# Patient Record
Sex: Female | Born: 1950 | Hispanic: Yes | Marital: Married | Smoking: Never smoker
Health system: Southern US, Community
[De-identification: ages and names within clinical notes are randomized; demographics above are authoritative.]

## PROBLEM LIST (undated history)

## (undated) DIAGNOSIS — R519 Headache, unspecified: Secondary | ICD-10-CM

## (undated) DIAGNOSIS — K7682 Hepatic encephalopathy: Secondary | ICD-10-CM

## (undated) DIAGNOSIS — F329 Major depressive disorder, single episode, unspecified: Secondary | ICD-10-CM

## (undated) DIAGNOSIS — K7460 Unspecified cirrhosis of liver: Secondary | ICD-10-CM

## (undated) DIAGNOSIS — J189 Pneumonia, unspecified organism: Secondary | ICD-10-CM

## (undated) DIAGNOSIS — K7290 Hepatic failure, unspecified without coma: Secondary | ICD-10-CM

## (undated) DIAGNOSIS — J42 Unspecified chronic bronchitis: Secondary | ICD-10-CM

## (undated) DIAGNOSIS — M549 Dorsalgia, unspecified: Secondary | ICD-10-CM

## (undated) DIAGNOSIS — K922 Gastrointestinal hemorrhage, unspecified: Secondary | ICD-10-CM

## (undated) DIAGNOSIS — K2970 Gastritis, unspecified, without bleeding: Secondary | ICD-10-CM

## (undated) DIAGNOSIS — R51 Headache: Secondary | ICD-10-CM

## (undated) DIAGNOSIS — G8929 Other chronic pain: Secondary | ICD-10-CM

## (undated) DIAGNOSIS — F32A Depression, unspecified: Secondary | ICD-10-CM

## (undated) DIAGNOSIS — I10 Essential (primary) hypertension: Secondary | ICD-10-CM

## (undated) HISTORY — DX: Hepatic encephalopathy: K76.82

## (undated) HISTORY — DX: Hepatic failure, unspecified without coma: K72.90

## (undated) HISTORY — PX: CHOLECYSTECTOMY OPEN: SUR202

## (undated) HISTORY — DX: Gastrointestinal hemorrhage, unspecified: K92.2

---

## 2015-10-09 ENCOUNTER — Encounter (HOSPITAL_COMMUNITY): Payer: Self-pay | Admitting: Emergency Medicine

## 2015-10-09 ENCOUNTER — Emergency Department (HOSPITAL_COMMUNITY): Payer: Self-pay

## 2015-10-09 ENCOUNTER — Inpatient Hospital Stay (HOSPITAL_COMMUNITY): Payer: Self-pay

## 2015-10-09 ENCOUNTER — Inpatient Hospital Stay (HOSPITAL_COMMUNITY)
Admission: EM | Admit: 2015-10-09 | Discharge: 2015-10-10 | DRG: 442 | Disposition: A | Discharge status: Home / Self Care | Payer: Self-pay | Attending: Family Medicine | Admitting: Family Medicine

## 2015-10-09 DIAGNOSIS — K7682 Hepatic encephalopathy: Secondary | ICD-10-CM | POA: Diagnosis present

## 2015-10-09 DIAGNOSIS — D696 Thrombocytopenia, unspecified: Secondary | ICD-10-CM | POA: Diagnosis present

## 2015-10-09 DIAGNOSIS — K7200 Acute and subacute hepatic failure without coma: Principal | ICD-10-CM | POA: Diagnosis present

## 2015-10-09 DIAGNOSIS — K7469 Other cirrhosis of liver: Secondary | ICD-10-CM

## 2015-10-09 DIAGNOSIS — N179 Acute kidney failure, unspecified: Secondary | ICD-10-CM | POA: Diagnosis present

## 2015-10-09 DIAGNOSIS — R109 Unspecified abdominal pain: Secondary | ICD-10-CM | POA: Diagnosis present

## 2015-10-09 DIAGNOSIS — K2970 Gastritis, unspecified, without bleeding: Secondary | ICD-10-CM | POA: Diagnosis present

## 2015-10-09 DIAGNOSIS — K7460 Unspecified cirrhosis of liver: Secondary | ICD-10-CM | POA: Diagnosis present

## 2015-10-09 DIAGNOSIS — N3941 Urge incontinence: Secondary | ICD-10-CM | POA: Diagnosis present

## 2015-10-09 DIAGNOSIS — R1013 Epigastric pain: Secondary | ICD-10-CM

## 2015-10-09 DIAGNOSIS — I10 Essential (primary) hypertension: Secondary | ICD-10-CM | POA: Diagnosis present

## 2015-10-09 DIAGNOSIS — R7989 Other specified abnormal findings of blood chemistry: Secondary | ICD-10-CM

## 2015-10-09 DIAGNOSIS — R001 Bradycardia, unspecified: Secondary | ICD-10-CM | POA: Diagnosis present

## 2015-10-09 DIAGNOSIS — K766 Portal hypertension: Secondary | ICD-10-CM | POA: Diagnosis present

## 2015-10-09 DIAGNOSIS — K7290 Hepatic failure, unspecified without coma: Secondary | ICD-10-CM | POA: Insufficient documentation

## 2015-10-09 DIAGNOSIS — J90 Pleural effusion, not elsewhere classified: Secondary | ICD-10-CM | POA: Diagnosis present

## 2015-10-09 DIAGNOSIS — D649 Anemia, unspecified: Secondary | ICD-10-CM | POA: Diagnosis present

## 2015-10-09 DIAGNOSIS — R161 Splenomegaly, not elsewhere classified: Secondary | ICD-10-CM | POA: Diagnosis present

## 2015-10-09 DIAGNOSIS — R1011 Right upper quadrant pain: Secondary | ICD-10-CM | POA: Diagnosis present

## 2015-10-09 DIAGNOSIS — K2950 Unspecified chronic gastritis without bleeding: Secondary | ICD-10-CM | POA: Diagnosis present

## 2015-10-09 HISTORY — DX: Essential (primary) hypertension: I10

## 2015-10-09 HISTORY — DX: Gastritis, unspecified, without bleeding: K29.70

## 2015-10-09 HISTORY — DX: Unspecified cirrhosis of liver: K74.60

## 2015-10-09 LAB — BRAIN NATRIURETIC PEPTIDE: B NATRIURETIC PEPTIDE 5: 97.8 pg/mL (ref 0.0–100.0)

## 2015-10-09 LAB — COMPREHENSIVE METABOLIC PANEL
ALBUMIN: 2.5 g/dL — AB (ref 3.5–5.0)
ALK PHOS: 111 U/L (ref 38–126)
ALT: 30 U/L (ref 14–54)
AST: 51 U/L — AB (ref 15–41)
Anion gap: 5 (ref 5–15)
BILIRUBIN TOTAL: 4.7 mg/dL — AB (ref 0.3–1.2)
BUN: 17 mg/dL (ref 6–20)
CALCIUM: 8.8 mg/dL — AB (ref 8.9–10.3)
CO2: 23 mmol/L (ref 22–32)
Chloride: 109 mmol/L (ref 101–111)
Creatinine, Ser: 1.04 mg/dL — ABNORMAL HIGH (ref 0.44–1.00)
GFR calc Af Amer: >60 mL/min (ref >60)
GFR calc non Af Amer: 56 mL/min — ABNORMAL LOW (ref >60)
GLUCOSE: 101 mg/dL — AB (ref 65–99)
Potassium: 4 mmol/L (ref 3.5–5.1)
Sodium: 137 mmol/L (ref 135–145)
Total Protein: 6.5 g/dL (ref 6.5–8.1)

## 2015-10-09 LAB — CBC WITH DIFFERENTIAL/PLATELET
Basophils Absolute: 0 10*3/uL (ref 0.0–0.1)
Basophils Relative: 1 %
EOS ABS: 0.2 10*3/uL (ref 0.0–0.7)
Eosinophils Relative: 5 %
HEMATOCRIT: 29.3 % — AB (ref 36.0–46.0)
HEMOGLOBIN: 10.3 g/dL — AB (ref 12.0–15.0)
LYMPHS ABS: 1.3 10*3/uL (ref 0.7–4.0)
Lymphocytes Relative: 34 %
MCH: 33.9 pg (ref 26.0–34.0)
MCHC: 35.2 g/dL (ref 30.0–36.0)
MCV: 96.4 fL (ref 78.0–100.0)
MONOS PCT: 9 %
Monocytes Absolute: 0.3 10*3/uL (ref 0.1–1.0)
NEUTROS ABS: 2 10*3/uL (ref 1.7–7.7)
NEUTROS PCT: 53 %
Platelets: 67 10*3/uL — ABNORMAL LOW (ref 150–400)
RBC: 3.04 MIL/uL — AB (ref 3.87–5.11)
RDW: 15.5 % (ref 11.5–15.5)
WBC: 3.7 10*3/uL — AB (ref 4.0–10.5)

## 2015-10-09 LAB — URINALYSIS, ROUTINE W REFLEX MICROSCOPIC
GLUCOSE, UA: NEGATIVE mg/dL
Ketones, ur: NEGATIVE mg/dL
Nitrite: NEGATIVE
PROTEIN: NEGATIVE mg/dL
SPECIFIC GRAVITY, URINE: 1.012 (ref 1.005–1.030)
pH: 7 (ref 5.0–8.0)

## 2015-10-09 LAB — URINE MICROSCOPIC-ADD ON

## 2015-10-09 LAB — GAMMA GT: GGT: 22 U/L (ref 7–50)

## 2015-10-09 LAB — AMMONIA: AMMONIA: 99 umol/L — AB (ref 9–35)

## 2015-10-09 LAB — CK: CK TOTAL: 157 U/L (ref 38–234)

## 2015-10-09 LAB — LIPASE, BLOOD: Lipase: 65 U/L — ABNORMAL HIGH (ref 11–51)

## 2015-10-09 MED ORDER — FUROSEMIDE 40 MG PO TABS
40.0000 mg | ORAL_TABLET | Freq: Every day | ORAL | Status: DC
  Administered 2015-10-09 – 2015-10-10 (×2): 40 mg via ORAL
  Filled 2015-10-09: qty 2
  Filled 2015-10-09: qty 1

## 2015-10-09 MED ORDER — ONDANSETRON HCL 4 MG/2ML IJ SOLN: 4.0000 mg | Freq: Three times a day (TID) | INTRAMUSCULAR | Status: AC | PRN

## 2015-10-09 MED ORDER — ACETAMINOPHEN 325 MG PO TABS: 650.0000 mg | ORAL_TABLET | Freq: Four times a day (QID) | ORAL | Status: DC | PRN

## 2015-10-09 MED ORDER — SUCRALFATE 1 GM/10ML PO SUSP
1.0000 g | Freq: Three times a day (TID) | ORAL | Status: DC
  Administered 2015-10-09 – 2015-10-10 (×2): 1 g via ORAL
  Filled 2015-10-09 (×2): qty 10

## 2015-10-09 MED ORDER — PANTOPRAZOLE SODIUM 40 MG PO TBEC
40.0000 mg | DELAYED_RELEASE_TABLET | Freq: Every day | ORAL | Status: DC
  Administered 2015-10-09: 40 mg via ORAL
  Filled 2015-10-09: qty 1

## 2015-10-09 MED ORDER — IOPAMIDOL (ISOVUE-300) INJECTION 61%
INTRAVENOUS | Status: AC
  Administered 2015-10-09: 100 mL
  Filled 2015-10-09: qty 100

## 2015-10-09 MED ORDER — SODIUM CHLORIDE 0.9 % IV SOLN
INTRAVENOUS | Status: DC
  Administered 2015-10-09: 15:00:00 via INTRAVENOUS

## 2015-10-09 MED ORDER — FAMOTIDINE IN NACL 20-0.9 MG/50ML-% IV SOLN
20.0000 mg | Freq: Two times a day (BID) | INTRAVENOUS | Status: DC
  Administered 2015-10-09 – 2015-10-10 (×2): 20 mg via INTRAVENOUS
  Filled 2015-10-09 (×3): qty 50

## 2015-10-09 MED ORDER — FAMOTIDINE IN NACL 20-0.9 MG/50ML-% IV SOLN
20.0000 mg | Freq: Once | INTRAVENOUS | Status: AC
  Administered 2015-10-09: 20 mg via INTRAVENOUS
  Filled 2015-10-09: qty 50

## 2015-10-09 MED ORDER — LACTULOSE 10 GM/15ML PO SOLN
30.0000 g | Freq: Once | ORAL | Status: AC
  Administered 2015-10-09: 30 g via ORAL
  Filled 2015-10-09: qty 45

## 2015-10-09 MED ORDER — SODIUM CHLORIDE 0.9 % IV BOLUS (SEPSIS)
500.0000 mL | Freq: Once | INTRAVENOUS | Status: AC
  Administered 2015-10-09: 500 mL via INTRAVENOUS

## 2015-10-09 MED ORDER — SODIUM CHLORIDE 0.9 % IV SOLN
125.0000 mg | Freq: Once | INTRAVENOUS | Status: AC
  Administered 2015-10-09: 125 mg via INTRAVENOUS
  Filled 2015-10-09: qty 10

## 2015-10-09 MED ORDER — ACETAMINOPHEN 650 MG RE SUPP: 650.0000 mg | Freq: Four times a day (QID) | RECTAL | Status: DC | PRN

## 2015-10-09 MED ORDER — ENOXAPARIN SODIUM 40 MG/0.4ML ~~LOC~~ SOLN: 40.0000 mg | SUBCUTANEOUS | Status: DC

## 2015-10-09 MED ORDER — LACTULOSE 10 GM/15ML PO SOLN
30.0000 g | Freq: Two times a day (BID) | ORAL | Status: DC
  Administered 2015-10-09 – 2015-10-10 (×2): 30 g via ORAL
  Filled 2015-10-09 (×3): qty 45

## 2015-10-09 MED ORDER — ONDANSETRON HCL 4 MG/2ML IJ SOLN: 4.0000 mg | Freq: Four times a day (QID) | INTRAMUSCULAR | Status: DC | PRN

## 2015-10-09 MED ORDER — PROPRANOLOL HCL 10 MG PO TABS
10.0000 mg | ORAL_TABLET | Freq: Two times a day (BID) | ORAL | Status: DC
Start: 2015-10-09 — End: 2015-10-09

## 2015-10-09 MED ORDER — IRON DEXTRAN 50 MG/ML IJ SOLN: 100.0000 mg | Freq: Once | INTRAMUSCULAR | Status: DC

## 2015-10-09 MED ORDER — ONDANSETRON HCL 4 MG PO TABS: 4.0000 mg | ORAL_TABLET | Freq: Four times a day (QID) | ORAL | Status: DC | PRN

## 2015-10-09 MED ORDER — PROPRANOLOL HCL 10 MG PO TABS
10.0000 mg | ORAL_TABLET | Freq: Two times a day (BID) | ORAL | Status: DC
  Administered 2015-10-09 – 2015-10-10 (×2): 10 mg via ORAL
  Filled 2015-10-09 (×2): qty 1

## 2015-10-09 NOTE — ED Notes (Signed)
Attempted report x1. 

## 2015-10-09 NOTE — ED Provider Notes (Signed)
[facility]-EMERGENCY DEPT Provider Note   CSN: [id number] Arrival date & time: 10/09/15  [id number]     History   Chief Complaint Chief Complaint  Patient presents with  . Abdominal Pain    HPI Kelly Mckinney is a 65 y.o. female.  HPI   Pt with hx cirrhosis (from medications), gastritis  p/w increase in chronic upper abdominal pain over the past 2 weeks.  Pain comes and goes.  Has had the symptoms for several years, treated in [place] for gastritis, has been taking the medications but it no longer helps.  [place] two [place].  Family states she has been intermittently confused - today was unsure of where she was, what she was doing.  Pt notes she has been depressed - family explains she has children in [place] she is unable to see.  Past abdominal surgeries: c-section, cholecystectomy.    Denies nausea, vomiting, bowel changes, urinary symptoms.  Pt unsure of her medications.    Past Medical History:  Diagnosis Date  . Hypertension     Patient Active Problem List   Diagnosis Date Noted  . Hepatic encephalopathy (HCC) 10/09/2015    History reviewed. No pertinent surgical history.  OB History    No data available       Home Medications    Prior to Admission medications   Medication Sig Start Date End Date Taking? Authorizing Provider  furosemide (LASIX) 40 MG tablet Take 40 mg by mouth daily.   Yes Historical Provider, MD  Omeprazole-Sodium Bicarbonate (ZEGERID) 20-1100 MG CAPS capsule Take 1 capsule by mouth daily before breakfast.   Yes Historical Provider, MD  OVER THE COUNTER MEDICATION Take 1 tablet by mouth daily. Pulmonarom Lisados bacterianos caja con 1 oral vial. For 10 days. Then back on for 10 days   Yes Historical Provider, MD  PRESCRIPTION MEDICATION Take 1 tablet by mouth every 6 (six) hours as needed (pain. ( clonixinaton) 125 mg clorthidraton 10 mg excipiente 1 tablet  ( from [place])).   Yes Historical Provider, MD  PRESCRIPTION MEDICATION Take 1 tablet by  mouth daily. ( Pisarpek. 500 mg 1 tablet daily.( from [place]) Levetiracetam   Yes Historical Provider, MD  propranolol (INDERAL) 10 MG tablet Take 10 mg by mouth 2 (two) times daily.   Yes Historical Provider, MD    Family History No family history on file.  Social History Social History  Substance Use Topics  . Smoking status: Never Smoker  . Smokeless tobacco: Never Used  . Alcohol use Not on file     Allergies   Review of patient's allergies indicates no known allergies.   Review of Systems Review of Systems  Unable to perform ROS: Mental status change     Physical Exam Updated Vital Signs BP 111/66   Pulse (!) 57   Temp 98.5 F (36.9 C) (Oral)   Resp 18   SpO2 100%   Physical Exam  Constitutional: She appears well-developed and well-nourished. No distress.  HENT:  Head: Normocephalic and atraumatic.  Neck: Neck supple.  Cardiovascular: Normal rate and regular rhythm.   Pulmonary/Chest: Effort normal and breath sounds normal. No respiratory distress. She has no wheezes. She has no rales.  Abdominal: Soft. Bowel sounds are normal. She exhibits no distension. There is tenderness in the epigastric area and left upper quadrant. There is no rebound and no guarding.  Neurological: She is alert.  Patient knows her name.  Does not know the month. Thinks it's 1997.  She  thinks she is currently in [place], [place].    Skin: She is not diaphoretic.  Psychiatric: She is slowed. She exhibits a depressed mood.  Nursing note and vitals reviewed.    ED Treatments / Results  Labs (all labs ordered are listed, but only abnormal results are displayed) Labs Reviewed  COMPREHENSIVE METABOLIC PANEL - Abnormal; Notable for the following:       Result Value   Glucose, Bld 101 (*)    Creatinine, Ser 1.04 (*)    Calcium 8.8 (*)    Albumin 2.5 (*)    AST 51 (*)    Total Bilirubin 4.7 (*)    GFR calc non Af Amer 56 (*)    All other components within normal limits  LIPASE,  BLOOD - Abnormal; Notable for the following:    Lipase 65 (*)    All other components within normal limits  CBC WITH DIFFERENTIAL/PLATELET - Abnormal; Notable for the following:    WBC 3.7 (*)    RBC 3.04 (*)    Hemoglobin 10.3 (*)    HCT 29.3 (*)    Platelets 67 (*)    All other components within normal limits  AMMONIA - Abnormal; Notable for the following:    Ammonia 99 (*)    All other components within normal limits  URINALYSIS, ROUTINE W REFLEX MICROSCOPIC (NOT AT [facility]) - Abnormal; Notable for the following:    Color, Urine AMBER (*)    APPearance CLOUDY (*)    Hgb urine dipstick LARGE (*)    Bilirubin Urine SMALL (*)    Leukocytes, UA SMALL (*)    All other components within normal limits  URINE MICROSCOPIC-ADD ON - Abnormal; Notable for the following:    Squamous Epithelial / LPF 6-30 (*)    Bacteria, UA MANY (*)    All other components within normal limits  BRAIN NATRIURETIC PEPTIDE    EKG  EKG Interpretation None       Radiology Dg Chest 2 View  Result Date: 10/09/2015 CLINICAL DATA:  Cardiomegaly and pleural effusion seen on CT. EXAM: CHEST  2 VIEW COMPARISON:  CT 10/09/2015 FINDINGS: There was trace left pleural fluid on the CT and this is not appreciated on these chest radiographs. Heart size is slightly enlarged. No focal airspace disease or pulmonary edema. No acute bone abnormality. IMPRESSION: Mild cardiomegaly without focal airspace disease or frank pulmonary edema. No significant pleural fluid. Electronically Signed   By: Richarda Overlie M.D.   On: 10/09/2015 13:46   Ct Head Wo Contrast  Result Date: 10/09/2015 CLINICAL DATA:  Confusion.  History of hypertension. EXAM: CT HEAD WITHOUT CONTRAST TECHNIQUE: Contiguous axial images were obtained from the base of the skull through the vertex without intravenous contrast. COMPARISON:  None. FINDINGS: Brain: Mild atrophy with sulcal prominence. Gray-white differentiation is maintained. No CT evidence of acute large  territory infarct. No intraparenchymal or extra-axial mass or hemorrhage. Normal size and configuration of the ventricles and the basilar cisterns. No midline shift. Vascular: No hyperdense vessel or unexpected calcification. Skull: No displaced calvarial fracture. Sinuses/Orbits: Limited visualization the paranasal sinuses and mastoid air cells is normal. No air-fluid levels. Other: Regional soft tissues appear normal. IMPRESSION: Mild atrophy without acute intracranial process. Electronically Signed   By: Simonne Come M.D.   On: 10/09/2015 12:20   Ct Abdomen Pelvis W Contrast  Result Date: 10/09/2015 CLINICAL DATA:  Diffuse abdominal pain for the past 2 days. Confusion. History of fatty liver disease. EXAM: CT ABDOMEN AND  PELVIS WITH CONTRAST TECHNIQUE: Multidetector CT imaging of the abdomen and pelvis was performed using the standard protocol following bolus administration of intravenous contrast. CONTRAST:  ISOVUE-300 IOPAMIDOL (ISOVUE-300) INJECTION 61% COMPARISON:  None. FINDINGS: Lower chest: Limited visualization of the lower thorax demonstrates a trace left-sided pleural effusion (image 1, series 2). Minimal dependent subpleural ground-glass atelectasis. No focal airspace opacities. Cardiomegaly. Small amount of pericardial fluid, presumably physiologic. Hepatobiliary: Nodular hepatic contour compatible with cirrhosis. No discrete hepatic lesions. There is re- cannulization of the tear umbilical vein (image 20, series 2) and development of a large splenorenal shunt (coronal image 61, series 5). No definitive esophageal or gastric varices. Trace amount of perihepatic ascites (image 92, series 5). Post cholecystectomy. No intra extrahepatic bili duct dilatation. Pancreas: Normal appearance of the pancreas. Spleen: Spleen is enlarged measuring 13.5 cm in length. Adrenals/Urinary Tract: There is symmetric enhancement and excretion of the bilateral kidneys. No renal stones. No discrete renal lesions.  No urine obstruction or perinephric stranding. Normal appearance of the bilateral adrenal glands. Normal appearance of the urinary bladder given degree distention. Stomach/Bowel: Nonobstructive bowel gas pattern. Normal appearance of the terminal ileum and appendix. No pneumoperitoneum, pneumatosis or portal venous gas. Vascular/Lymphatic: Normal caliber of the abdominal aorta. The major branch vessels of the abdominal aorta appear patent on this non CTA examination. No bulky retroperitoneal, mesenteric, pelvic or inguinal lymphadenopathy. Reproductive: Normal appearance of the pelvic organs. There is a trace amount of fluid within the pelvic cul-de-sac. Other: Large amount of body wall edema. Small mesenteric fat containing periumbilical hernia. Musculoskeletal: No acute or aggressive osseous abnormalities. IMPRESSION: 1. Cirrhosis with findings of portal venous hypertension including recanalization of the periumbilical vein, development of a large splenorenal shunt and mild splenomegaly. No definitive gastric or esophageal varices. No significant intra-abdominal ascites. Given history of confusion, correlation with ammonia level is recommended. 2. Otherwise, no definite explanation for patient's abdominal pain. 3. Cardiomegaly with trace left-sided pleural effusion and rather extensive body wall edema, nonspecific though could be seen in the setting of right-sided heart failure. Clinical correlation is advised. Electronically Signed   By: Simonne Come M.D.   On: 10/09/2015 12:28    Procedures Procedures (including critical care time)  Medications Ordered in ED Medications  sodium chloride 0.9 % bolus 500 mL (0 mLs Intravenous Stopped 10/09/15 1243)  famotidine (PEPCID) IVPB 20 mg premix (0 mg Intravenous Stopped 10/09/15 1040)  lactulose (CHRONULAC) 10 GM/15ML solution 30 g (30 g Oral Given 10/09/15 1242)  iopamidol (ISOVUE-300) 61 % injection (100 mLs  Contrast Given 10/09/15 1140)     Initial  Impression / Assessment and Plan / ED Course  I have reviewed the triage vital signs and the nursing notes.  Pertinent labs & imaging results that were available during my care of the patient were reviewed by me and considered in my medical decision making (see chart for details).  Clinical Course  Value Comment By Time  Ammonia: (!) 99 (Reviewed) Trixie Dredge, PA-C 08/18 1003    Afebrile nontoxic patient with hx cirrhosis, gastritis p/w upper abdominal pain, confusion per family.  Labs consistent with hepatic encephalopathy - liver abnormalities, elevated ammonia level.  CT abd/pelvis demonstrates changes associated with hepatic encephalopathy.  Pt also seen and examined by Dr Manus Gunning.  Admitted to [facility].  Toya Smothers, APP, accepts patient (Dr Dartha Lodge attending)  Final Clinical Impressions(s) / ED Diagnoses   Final diagnoses:  Hepatic encephalopathy [facility])    New Prescriptions New Prescriptions   No  medications on file     Trixie Dredgemily Cash Duce, [place]PA-C 10/09/15 1433    Glynn OctaveStephen Rancour, MD 10/09/15 [phone]

## 2015-10-09 NOTE — ED Notes (Signed)
Pt transported to [place]

## 2015-10-09 NOTE — ED Notes (Signed)
Pt used bedside commode with minimal assistance; BM

## 2015-10-09 NOTE — H&P (Signed)
History and Physical    Kelly Mckinney ZOX:[id number] DOB: Apr 18, 1950 DOA: 10/09/2015  PCP: No primary care provider on file. Patient coming from: home  Chief Complaint: abdominal pain/ams  HPI: Kelly Mckinney is a non english speaking 65 y.o. female with medical history significant for cirrhosis, gastritis, htn since emergency Department chief complaint of abdominal pain and altered mental status. Initial evaluation concerning for acute hepatic encephalopathy.  Information is obtained from the patient and her family through her family as she is not speaking this. She's been in this country about 3 weeks. She reports chronic upper abdominal pain that worsened over the last 3 or 4 days. She states that she's been treated for gastritis in [place] and taking her medicine properly. She states the pain is located right upper quadrant constant describes it as an ache. Associated symptoms include generalized lethargy, increased lower extremity edema, gradual shortness of breath and gradual worsening confusion per the family. No reports of nausea vomiting. No diarrhea constipation melena bright red blood per rectum. She does have urinary incontinence and urgency. She denies dysuria hematuria. She denies chest pain palpitation headache dizziness visual disturbances.    ED Course: The emergency department she's afebrile hemodynamically stable and not hypoxic. She is provided with 500 mL of normal saline and lactulose.  Review of Systems: As per HPI otherwise 10 point review of systems negative.   Ambulatory Status: She ambulates with a cane with a steady gait denies any recent falls  Past Medical History:  Diagnosis Date  . Cirrhosis (HCC)   . Gastritis   . Hypertension     History reviewed. No pertinent surgical history. Cholecystectomy and C-section  Social History   Social History  . Marital status: Married    Spouse name: N/A  . Number of children: N/A  . Years of education: N/A    Occupational History  . Not on file.   Social History Main Topics  . Smoking status: Never Smoker  . Smokeless tobacco: Never Used  . Alcohol use Not on file  . Drug use: Unknown  . Sexual activity: Not on file   Other Topics Concern  . Not on file   Social History Narrative  . No narrative on file    No Known Allergies  No family history on file. Unable to obtain family history at this time  Prior to Admission medications   Medication Sig Start Date End Date Taking? Authorizing Provider  furosemide (LASIX) 40 MG tablet Take 40 mg by mouth daily.   Yes Historical Provider, MD  Omeprazole-Sodium Bicarbonate (ZEGERID) 20-1100 MG CAPS capsule Take 1 capsule by mouth daily before breakfast.   Yes Historical Provider, MD  OVER THE COUNTER MEDICATION Take 1 tablet by mouth daily. Pulmonarom Lisados bacterianos caja con 1 oral vial. For 10 days. Then back on for 10 days   Yes Historical Provider, MD  PRESCRIPTION MEDICATION Take 1 tablet by mouth every 6 (six) hours as needed (pain. ( clonixinaton) 125 mg clorthidraton 10 mg excipiente 1 tablet  ( from [place])).   Yes Historical Provider, MD  PRESCRIPTION MEDICATION Take 1 tablet by mouth daily. ( Pisarpek. 500 mg 1 tablet daily.( from [place]) Levetiracetam   Yes Historical Provider, MD  propranolol (INDERAL) 10 MG tablet Take 10 mg by mouth 2 (two) times daily.   Yes Historical Provider, MD    Physical Exam: Vitals:   10/09/15 1040 10/09/15 1242 10/09/15 1403 10/09/15 1506  BP: 135/75 123/72 111/66 131/71  Pulse: 63 62 (!)  57 (!) 56  Resp: 16 18 18 17   Temp:  98.5 F (36.9 C)    TempSrc:  Oral    SpO2: 100% 100% 100% 100%     General:  Appears calm and comfortable  Eyes:  PERRL, EOMI, normal lids, iris + scleral icterus ENT:  grossly normal hearing, lips & tongue, his membranes of her mouth are pink and moist Neck:  no LAD, masses or thyromegaly Cardiovascular:  RRR, no m/r/g. Trace LE edema. Venous stasis changes  bilaterally Respiratory:  CTA bilaterally, no w/r/r. Normal respiratory effort. Abdomen:  Soft, slightly distended +BS mild tenderness upper quadrants Skin:  no rash or induration seen on limited exam somewhat jaundiced Musculoskeletal:  grossly normal tone BUE/BLE, good ROM, no bony abnormality Psychiatric:  grossly normal mood and affect, slightly anxious Neurologic:  Speech clear, facial symmetry follows commands. Oriented to self only  Labs on Admission: I have personally reviewed following labs and imaging studies  CBC:  Recent Labs Lab 10/09/15 0919  WBC 3.7*  NEUTROABS 2.0  HGB 10.3*  HCT 29.3*  MCV 96.4  PLT 67*   Basic Metabolic Panel:  Recent Labs Lab 10/09/15 0919  NA 137  K 4.0  CL 109  CO2 23  GLUCOSE 101*  BUN 17  CREATININE 1.04*  CALCIUM 8.8*   GFR: CrCl cannot be calculated (Unknown ideal weight.). Liver Function Tests:  Recent Labs Lab 10/09/15 0919  AST 51*  ALT 30  ALKPHOS 111  BILITOT 4.7*  PROT 6.5  ALBUMIN 2.5*    Recent Labs Lab 10/09/15 0919  LIPASE 65*    Recent Labs Lab 10/09/15 0919  AMMONIA 99*   Coagulation Profile: No results for input(s): INR, PROTIME in the last 168 hours. Cardiac Enzymes: No results for input(s): CKTOTAL, CKMB, CKMBINDEX, TROPONINI in the last 168 hours. BNP (last 3 results) No results for input(s): PROBNP in the last 8760 hours. HbA1C: No results for input(s): HGBA1C in the last 72 hours. CBG: No results for input(s): GLUCAP in the last 168 hours. Lipid Profile: No results for input(s): CHOL, HDL, LDLCALC, TRIG, CHOLHDL, LDLDIRECT in the last 72 hours. Thyroid Function Tests: No results for input(s): TSH, T4TOTAL, FREET4, T3FREE, THYROIDAB in the last 72 hours. Anemia Panel: No results for input(s): VITAMINB12, FOLATE, FERRITIN, TIBC, IRON, RETICCTPCT in the last 72 hours. Urine analysis:    Component Value Date/Time   COLORURINE AMBER (A) 10/09/2015 1023   APPEARANCEUR CLOUDY (A)  10/09/2015 1023   LABSPEC 1.012 10/09/2015 1023   PHURINE 7.0 10/09/2015 1023   GLUCOSEU NEGATIVE 10/09/2015 1023   HGBUR LARGE (A) 10/09/2015 1023   BILIRUBINUR SMALL (A) 10/09/2015 1023   KETONESUR NEGATIVE 10/09/2015 1023   PROTEINUR NEGATIVE 10/09/2015 1023   NITRITE NEGATIVE 10/09/2015 1023   LEUKOCYTESUR SMALL (A) 10/09/2015 1023    Creatinine Clearance: CrCl cannot be calculated (Unknown ideal weight.).  Sepsis Labs: @LABRCNTIP (procalcitonin:4,lacticidven:4) )No results found for this or any previous visit (from the past 240 hour(s)).   Radiological Exams on Admission: Dg Chest 2 View  Result Date: 10/09/2015 CLINICAL DATA:  Cardiomegaly and pleural effusion seen on CT. EXAM: CHEST  2 VIEW COMPARISON:  CT 10/09/2015 FINDINGS: There was trace left pleural fluid on the CT and this is not appreciated on these chest radiographs. Heart size is slightly enlarged. No focal airspace disease or pulmonary edema. No acute bone abnormality. IMPRESSION: Mild cardiomegaly without focal airspace disease or frank pulmonary edema. No significant pleural fluid. Electronically Signed   By: Madelaine Bhat  Lowella DandyHenn M.D.   On: 10/09/2015 13:46   Ct Head Wo Contrast  Result Date: 10/09/2015 CLINICAL DATA:  Confusion.  History of hypertension. EXAM: CT HEAD WITHOUT CONTRAST TECHNIQUE: Contiguous axial images were obtained from the base of the skull through the vertex without intravenous contrast. COMPARISON:  None. FINDINGS: Brain: Mild atrophy with sulcal prominence. Gray-white differentiation is maintained. No CT evidence of acute large territory infarct. No intraparenchymal or extra-axial mass or hemorrhage. Normal size and configuration of the ventricles and the basilar cisterns. No midline shift. Vascular: No hyperdense vessel or unexpected calcification. Skull: No displaced calvarial fracture. Sinuses/Orbits: Limited visualization the paranasal sinuses and mastoid air cells is normal. No air-fluid levels. Other:  Regional soft tissues appear normal. IMPRESSION: Mild atrophy without acute intracranial process. Electronically Signed   By: Simonne ComeJohn  Watts M.D.   On: 10/09/2015 12:20   Ct Abdomen Pelvis W Contrast  Result Date: 10/09/2015 CLINICAL DATA:  Diffuse abdominal pain for the past 2 days. Confusion. History of fatty liver disease. EXAM: CT ABDOMEN AND PELVIS WITH CONTRAST TECHNIQUE: Multidetector CT imaging of the abdomen and pelvis was performed using the standard protocol following bolus administration of intravenous contrast. CONTRAST:  100mL ISOVUE-300 IOPAMIDOL (ISOVUE-300) INJECTION 61% COMPARISON:  None. FINDINGS: Lower chest: Limited visualization of the lower thorax demonstrates a trace left-sided pleural effusion (image 1, series 2). Minimal dependent subpleural ground-glass atelectasis. No focal airspace opacities. Cardiomegaly. Small amount of pericardial fluid, presumably physiologic. Hepatobiliary: Nodular hepatic contour compatible with cirrhosis. No discrete hepatic lesions. There is re- cannulization of the tear umbilical vein (image 20, series 2) and development of a large splenorenal shunt (coronal image 61, series 5). No definitive esophageal or gastric varices. Trace amount of perihepatic ascites (image 92, series 5). Post cholecystectomy. No intra extrahepatic bili duct dilatation. Pancreas: Normal appearance of the pancreas. Spleen: Spleen is enlarged measuring 13.5 cm in length. Adrenals/Urinary Tract: There is symmetric enhancement and excretion of the bilateral kidneys. No renal stones. No discrete renal lesions. No urine obstruction or perinephric stranding. Normal appearance of the bilateral adrenal glands. Normal appearance of the urinary bladder given degree distention. Stomach/Bowel: Nonobstructive bowel gas pattern. Normal appearance of the terminal ileum and appendix. No pneumoperitoneum, pneumatosis or portal venous gas. Vascular/Lymphatic: Normal caliber of the abdominal aorta. The  major branch vessels of the abdominal aorta appear patent on this non CTA examination. No bulky retroperitoneal, mesenteric, pelvic or inguinal lymphadenopathy. Reproductive: Normal appearance of the pelvic organs. There is a trace amount of fluid within the pelvic cul-de-sac. Other: Large amount of body wall edema. Small mesenteric fat containing periumbilical hernia. Musculoskeletal: No acute or aggressive osseous abnormalities. IMPRESSION: 1. Cirrhosis with findings of portal venous hypertension including recanalization of the periumbilical vein, development of a large splenorenal shunt and mild splenomegaly. No definitive gastric or esophageal varices. No significant intra-abdominal ascites. Given history of confusion, correlation with ammonia level is recommended. 2. Otherwise, no definite explanation for patient's abdominal pain. 3. Cardiomegaly with trace left-sided pleural effusion and rather extensive body wall edema, nonspecific though could be seen in the setting of right-sided heart failure. Clinical correlation is advised. Electronically Signed   By: Simonne ComeJohn  Watts M.D.   On: 10/09/2015 12:28    EKG: Independently reviewed pending  Assessment/Plan Principal Problem:   Acute hepatic encephalopathy (HCC) Active Problems:   Anemia   Acute kidney injury (HCC)   Thrombocytopenia (HCC)   Pleural effusion   Abdominal pain   Gastritis   Bradycardia   Cirrhosis (HCC)  1. Acute hepatic encephalopathy. Ammonia level 99. Total bilirubin 4.6. Patient with a history of cirrhosis reportedly. CT of the head without acute abnormality. chest x-ray with mild cardiomegaly without frank pulmonary edema. Afebrile, no leukocytosis non-toxic appearing.  He is provided with lactulose in the emergency department. Urinalysis unremarkable.  -admit to med surg -continue lactulose -obtain US abdomen -Obtain echocardiogram -monitor  #2. Cirrhosis/abdominal pain. Hx of same.  Etiology unclear. AST slightly  elevated ALT alkaline phosphatase within limits of normal. Total bilirubin 4.7. Lipase within limits of normal.  Abdomina CT with cirrhosis and portal venous HTN large splenorenal shunt and splenomegaly.  -continue BB with parameters -continue lactulose -continue lasix -obtain US -Obtain echocardiogram -low threshold for antibiotics as some concern for SBP  #3. Acute kidney injury. Mild. Creatinine 1.04 on admission. Likely related to decreased oral intake. -Hold nephrotoxins -Monitor urine output  #4. Thrombocytopenia. Likely related to #2. No obvious signs symptoms bleeding. -Obtain PT/INR -SCDs  #5. Abdominal pain/gastritis. Patient reports pain resolved on admission. Reports her PCP in [place] treating her for chronic gastritis.  -PPI  #6. Bradycardia. Rate range 57-63. Likely related to panel all -Continue propanolol with parameters -Obtain an EKG   #7. Pleural effusion. Small on left her CT above. Oxygen saturation levels 100% on room air -Monitor   DVT prophylaxis: scd  Code Status: full  Family Communication: family at bedside  Disposition Plan: home when ready  Consults called: none Admission status: inpatient    Gwenyth BenderBLACK,KAREN M MD [facility]  If 7PM-7AM, please contact night-coverage [email or web address] Password [email or web address]  10/09/2015, 3:39 PM

## 2015-10-09 NOTE — ED Triage Notes (Addendum)
abd pain x 2 days  No vomiting or diarrhea last ate yesterday hurts all over hurts to void, per family pt has fatty liver and cannot take meds that affect her liver

## 2015-10-09 NOTE — ED Notes (Signed)
Changed the pt's brief and Chux.  Placed another Chux under the patient and brief on the patient.

## 2015-10-09 NOTE — ED Notes (Signed)
Pt back in room.

## 2015-10-09 NOTE — ED Notes (Signed)
Attempted report x 2 

## 2015-10-09 NOTE — ED Provider Notes (Signed)
EMERGENCY DEPARTMENT US FAST EXAM  INDICATIONS:Teaching study  PERFORMED BY: Myself  IMAGES ARCHIVED?: Yes  FINDINGS: All views negative  LIMITATIONS:  Body habitus and Emergent procedure  INTERPRETATION:  No abdominal free fluid and No pericardial effusion  COMMENT:  No ascites     Glynn OctaveStephen Derrious Bologna, MD 10/09/15 1552

## 2015-10-10 DIAGNOSIS — R109 Unspecified abdominal pain: Secondary | ICD-10-CM

## 2015-10-10 DIAGNOSIS — K7469 Other cirrhosis of liver: Secondary | ICD-10-CM

## 2015-10-10 DIAGNOSIS — K2970 Gastritis, unspecified, without bleeding: Secondary | ICD-10-CM

## 2015-10-10 DIAGNOSIS — J90 Pleural effusion, not elsewhere classified: Secondary | ICD-10-CM

## 2015-10-10 DIAGNOSIS — D696 Thrombocytopenia, unspecified: Secondary | ICD-10-CM

## 2015-10-10 DIAGNOSIS — K7200 Acute and subacute hepatic failure without coma: Principal | ICD-10-CM

## 2015-10-10 DIAGNOSIS — N179 Acute kidney failure, unspecified: Secondary | ICD-10-CM

## 2015-10-10 DIAGNOSIS — D649 Anemia, unspecified: Secondary | ICD-10-CM

## 2015-10-10 DIAGNOSIS — R7989 Other specified abnormal findings of blood chemistry: Secondary | ICD-10-CM

## 2015-10-10 LAB — BASIC METABOLIC PANEL
Anion gap: 7 (ref 5–15)
BUN: 12 mg/dL (ref 6–20)
CHLORIDE: 112 mmol/L — AB (ref 101–111)
CO2: 24 mmol/L (ref 22–32)
CREATININE: 0.86 mg/dL (ref 0.44–1.00)
Calcium: 8.7 mg/dL — ABNORMAL LOW (ref 8.9–10.3)
GFR calc non Af Amer: >60 mL/min (ref >60)
Glucose, Bld: 74 mg/dL (ref 65–99)
Potassium: 3.5 mmol/L (ref 3.5–5.1)
Sodium: 143 mmol/L (ref 135–145)

## 2015-10-10 LAB — AMMONIA: Ammonia: 68 umol/L — ABNORMAL HIGH (ref 9–35)

## 2015-10-10 LAB — CBC
HCT: 31.7 % — ABNORMAL LOW (ref 36.0–46.0)
Hemoglobin: 10.5 g/dL — ABNORMAL LOW (ref 12.0–15.0)
MCH: 33 pg (ref 26.0–34.0)
MCHC: 33.1 g/dL (ref 30.0–36.0)
MCV: 99.7 fL (ref 78.0–100.0)
PLATELETS: 60 10*3/uL — AB (ref 150–400)
RBC: 3.18 MIL/uL — AB (ref 3.87–5.11)
RDW: 15.6 % — ABNORMAL HIGH (ref 11.5–15.5)
WBC: 4 10*3/uL (ref 4.0–10.5)

## 2015-10-10 LAB — HEPATITIS PANEL, ACUTE
HEP A IGM: NEGATIVE
HEP B C IGM: NEGATIVE
Hepatitis B Surface Ag: NEGATIVE

## 2015-10-10 MED ORDER — LACTULOSE 10 GM/15ML PO SOLN: 30.0000 g | Freq: Two times a day (BID) | ORAL | 0 refills | Status: DC

## 2015-10-10 NOTE — Discharge Instructions (Signed)
Cirrosis (Cirrhosis) La cirrosis es una enfermedad heptica a largo plazo (crnica). El hgado es el rgano interno ms grande y cumple muchas funciones. Este rgano transforma los [employer], elimina las sustancias txicas de la [place], [place] protenas importantes y absorbe las vitaminas necesarias de la dieta. En las personas que tienen cirrosis, muchas de las clulas hepticas han sido reemplazadas por tejido cicatricial. Esto impide que la sangre circule por el hgado, lo que dificulta el funcionamiento de este rgano. Esta fibrosis heptica es irreversible, pero el tratamiento puede evitar que empeore.  CAUSAS  La hepatitisC y el consumo prolongado de alcohol son las causas ms comunes de la cirrosis. Otras causas son las siguientes:  Enfermedad del hgado graso no relacionada con el alcohol.  Infeccin por hepatitisB.  Hepatitis autoinmune.  Enfermedades que [employer] conductos dentro del hgado.  Enfermedades hepticas hereditarias.  Reacciones a determinados medicamentos que se toman a Barrister's clerk.  Infecciones por parsitos.  Exposicin prolongada a ciertas sustancias txicas. FACTORES DE RIESGO Puede tener un riesgo ms alto de tener cirrosis en los siguientes casos:  Tiene ciertos virus de la hepatitis.  Consume alcohol en exceso, especialmente si es mujer.  Tiene sobrepeso.  Comparte agujas.  Tiene relaciones sexuales con alguien que tiene hepatitis. SNTOMAS  Es posible que no tenga signos ni sntomas al principio. Puede que los sntomas no se manifiesten hasta que el dao heptico empiece a Copy. Los signos y los sntomas de cirrosis pueden incluir los siguientes:   Dolor a la palpacin en la parte superior derecha del abdomen.  Debilidad y cansancio (fatiga).  Prdida del apetito.  Nuseas.  Adelgazamiento y prdida de la masa muscular.  Picazn.  Piel y ojos amarillos (ictericia).  Acumulacin de lquido en el abdomen  (ascitis).  Hinchazn de los pies y los tobillos (edema).  Aparicin de vasos sanguneos diminutos debajo de la piel.  Confusin mental.  Tener hematomas o hemorragias. DIAGNSTICO  El mdico puede sospechar la presencia de cirrosis en funcin de los sntomas y la historia clnica, en especial si tiene otras enfermedades o antecedentes de consumo de alcohol. El mdico har un examen fsico para palparle el hgado y buscar signos de cirrosis. El mdico puede realizar otras [place], entre ellas:   Anlisis de sangre para controlar:  Si tiene hepatitisB oC.  La funcin renal.  La funcin heptica.  Pruebas de diagnstico por imgenes, por ejemplo:  Resonancia magntica (RM) o tomografa computarizada (TC) para buscar los cambios que se observan en los casos de cirrosis [place].  Ecografa para determinar si el tejido heptico normal est siendo reemplazado por tejido cicatricial.  Un procedimiento en el que se [place] una aguja larga para tomar [place] de tejido heptico (biopsia) para ser examinado con un microscopio. La biopsia de hgado puede confirmar el diagnstico de cirrosis. TRATAMIENTO  El tratamiento depende de la magnitud del dao heptico y qu lo caus. Puede incluir el tratamiento de los sntomas de cirrosis o de las causas preexistentes de la enfermedad, a fin de Psychologist, clinical el avance del [place]. El tratamiento puede incluir lo siguiente:  Cambios de estilo de vida, como:  Consumir una dieta saludable.  Restringir la ingesta de sal.  Mantener un peso saludable.  No consumir drogas o alcohol.  Tomar medicamentos para:  Risk manager las infecciones del hgado u otras infecciones.  Controlar la picazn.  Disminuir la acumulacin de lquido.  Reducir ciertas sustancias txicas de la sangre.  Reducir el riesgo de hemorragia de los  vasos sanguneos agrandados en el estmago o el esfago (vrices).  Si las vrices causan problemas hemorrgicos, tal vez deba  someterse a un tratamiento con un procedimiento mediante el cual los vasos sanguneos se anudan y se caen (ligadura con banda).  Si la cirrosis est causando insuficiencia heptica, el mdico puede recomendar un trasplante de hgado.  Se pueden recomendar otros tratamientos en funcin de las complicaciones de la cirrosis, como insuficiencia renal relacionada con el hgado (sndrome hepatorrenal). INSTRUCCIONES PARA EL CUIDADO EN EL HOGAR   Tome los medicamentos solamente como se lo haya indicado el mdico. No consuma medicamentos que sean txicos para el hgado. Consulte al mdico antes de tomar algn medicamento nuevo, incluidos los de [place]venta libre.  Descanse todo lo que sea necesario.  Consumir una Tree surgeondieta bien balanceada. Solicite ms informacin al mdico o al nutricionista.  Tal vez deba seguir una dieta con bajo contenido de sal o restringir el consumo de agua como se lo hayan indicado.  No beba alcohol. Esto es especialmente importante si est tomando paracetamol.  Concurra a todas las visitas de control como se lo haya indicado el mdico. Esto es importante. SOLICITE ATENCIN MDICA SI:  Tiene fatiga o debilidad que empeora.  Observa que se le Eli Lilly and Companyhinchan las manos, los pies, las piernas o la cara.  Tiene fiebre.  Pierde el apetito.  Tiene nuseas o vmitos.  Tiene ictericia.  Se le forman hematomas o sangra con facilidad. SOLICITE ATENCIN MDICA DE INMEDIATO SI:  Vomita sangre de color rojo brillante o una sustancia parecida a los granos de caf.  Observa sangre en las heces.  Las heces son de color negro y de aspecto alquitranado.  Se siente confundido.  Tiene dolor en el pecho o dificultad para respirar.   Esta informacin no tiene Theme park managercomo fin reemplazar el consejo del mdico. Asegrese de hacerle al mdico cualquier pregunta que tenga.   Document Released: 02/07/2005 Document Revised: 02/28/2014 Elsevier Interactive Patient Education 2016 Tyson FoodsElsevier  Inc.     Encefalopata heptica (Hepatic Encephalopathy) La encefalopata heptica es la prdida de funcin cerebral debido a una enfermedad heptica avanzada. Los efectos de esta afeccin dependen del tipo y la gravedad del dao heptico. En algunos casos, la encefalopata heptica se puede revertir. CAUSAS No se conoce la causa exacta de la encefalopata heptica. FACTORES DE RIESGO Si tiene el hgado daado, es mayor el riesgo de contraer esta enfermedad. Cuando el hgado est daado, se pueden acumular sustancias nocivas, llamadas toxinas, en el organismo. Ciertas toxinas, como el [place], pueden daar el cerebro. Otras condiciones que pueden daar el hgado incluyen las siguientes:  Una infeccin.  Deshidratacin.  Hemorragia intestinal.  Beber alcohol en exceso.  Tomar determinados medicamentos, incluidos los tranquilizantes, antidepresivos, pastillas para dormir y pastillas para eliminar lquido (diurticos). Marnee SpringSIGNOS Y SNTOMAS Los signos y sntomas pueden comenzar de forma repentina. O bien, pueden comenzar lentamente y Theme park managerempeorar de forma gradual. Los sntomas pueden variar de leves a graves. Encefalopata heptica leve  Confusin leve.  Cambios en el estado de nimo y la personalidad.  Ansiedad y Designer, jewelleryagitacin.  Somnolencia.  Prdida de capacidades mentales.  Aliento con olor rancio o King [place]ulce. Encefalopata heptica progresiva o grave  Movimientos lentos.  Hablar arrastrando las palabras.  Cambios extremos en la personalidad.  Desorientacin.  Temblor anormal o aleteo de las manos.  Coma. DIAGNSTICO Para hacer un diagnstico, el mdico realizar un examen fsico. Tambin puede indicar estudios para descartar otras causas de los signos y los sntomas. Algunos de los MetLifeestudios  que probablemente deba tener que realizarse son los siguientes:  Anlisis de [place]re. Se pueden realizar para Geophysical data processorcontrolar el nivel de [place]amonaco, medir el tiempo que tarda la sangre en coagular y  detectar si hay alguna infeccin.  Pruebas funcionales hepticas. Se pueden llevar a cabo para saber si el hgado funciona correctamente.  Resonancias magnticas (RM) y tomografas computarizadas (TC). Se pueden realizar para Public house managerdetectar algn trastorno cerebral.  Electroencefalograma (EEG). Este estudio mide la actividad elctrica del cerebro. TRATAMIENTO El Pensions consultantprimer paso en el tratamiento es identificar y tratar los posibles factores desencadenantes. El siguiente paso es tomar medicamentos para Copybajar el nivel de toxinas en el cuerpo y Automotive engineerevitar que se acumule el amonaco. Es posible que necesite tomar los siguientes:  Antibiticos para reducir las bacterias que producen amonaco en el intestino.  Lactulosa para evacuar el amonaco del intestino. INSTRUCCIONES PARA EL CUIDADO EN EL HOGAR Comida y bebida  Realice una dieta baja en protenas que incluya muchas frutas y verduras, y cereales integrales, como se lo haya indicado el mdico. El amonaco se produce al digerir alimentos con un alto nivel de protenas.  Consulte a un nutricionista o a su mdico para asegurarse de que su dieta tenga el equilibrio adecuado de protenas y minerales.  Beba suficiente lquido para Photographermantener la orina clara o de color amarillo plido. Beber mucha agua ayuda a Chief Strategy Officerevitar el estreimiento.  No beba alcohol ni consuma drogas. Medicamentos  CenterPoint Energyome los medicamentos solamente como se lo haya indicado el mdico.  Si le recetaron antibiticos, asegrese de terminarlos, incluso si comienza a sentirse mejor.  No comience ningn medicamento nuevo, incluidos los de [address] libre, sin antes consultar a su mdico. SOLICITE ATENCIN MDICA SI:  Aparecen nuevos sntomas.  Los sntomas [place]cambian.  Los sntomas empeoran.  Tiene fiebre.  Tiene estreimiento.  Tiene nuseas, vmitos o diarrea persistentes. SOLICITE ATENCIN MDICA DE INMEDIATO SI:  Est muy confundido o somnoliento.  Vomita sangre de color rojo brillante o  una sustancia similar a los granos de caf.  Las heces son de color negro o sanguinolentas o de aspecto alquitranado.   Esta informacin no tiene Theme park managercomo fin reemplazar el consejo del mdico. Asegrese de hacerle al mdico cualquier pregunta que tenga.   Document Released: 05/26/2008 Document Revised: 02/28/2014 Elsevier Interactive Patient Education 2016 Ar[employer]Elsevier Inc.   Anlisis de amonaco (Ammonia Test) POR QU ME DEBO REALIZAR ESTE ANLISIS? El anlisis de [place]aco se [place]a para ayudar a Education administratordiagnosticar y Chief Operating Officercontrolar las enfermedades hepticas graves. Tambin, para diagnosticar y Chief Operating Officercontrolar un trastorno cerebral que puede aparecer en las personas que tienen enfermedades hepticas (encefalopata heptica).  La concentracin de amonaco puede aumentar cuando el funcionamiento del hgado y los riones no es lo bastante bueno como para Producer, television/film/videoeliminar la rea. La acumulacin de Halliburton Companyamonaco en el organismo puede provocar cambios mentales y neurolgicos que pueden causar confusin, desorientacin, somnolencia y, en ltima Silver Firsinstancia, Atlantisestado de coma e, incluso, la muerte. Los lactantes y los nios que tienen altas concentraciones de amonaco pueden vomitar con frecuencia, estar irritables y cada vez ms letrgicos. Sin tratamiento, pueden tener convulsiones y dificultad para Industrial/product designerrespirar, Cytogeneticistentrar en estado de coma y morir. QU TIPO DE MUESTRA SE TOMA? Se requiere [place]una muestra de sangre para [place]este anlisis. Por lo general, para extraerla, se introduce una aguja en una vena. CMO DEBO PREPARARME PARA ESTE ANLISIS? Siga las indicaciones del mdico o del pediatra en cuanto a evitar lo siguiente antes de este anlisis:  Hacer actividad fsica.  Consumo de cigarrillos.  Algunos medicamentos.  QU SON LOS VALORES DE REFERENCIA? Los valores de referencia son los valores saludables establecidos despus de realizarle el anlisis a un grupo grande de personas sanas. Pueden variar [employer], laboratorios y  hospitales. Es su responsabilidad retirar el resultado del [place]. Consulte en el laboratorio o en el departamento en el que fue realizado el estudio cundo y cmo podr [employer].  QU SIGNIFICAN LOS RESULTADOS? El aumento de las concentraciones de Hallibur[employer] Company puede significar que usted o el nio tienen lo siguiente:  Enfermedad heptica.  Sangrado u obstruccin gastrointestinal (GI).  Insuficiencia cardaca grave.  Enfermedad hemoltica del recin nacido.  Encefalopata heptica.  Un trastorno metablico gentico. La disminucin de las concentraciones de amonaco puede significar que usted o el nio tienen lo siguiente:  Presin arterial elevada (hipertensin arterial).  Un sndrome metablico gentico. Hable con el mdico sobre [address] [place], las opciones de tratamiento y, si es necesario, la necesidad de Education officer, environmental ms [place]. Hable con el mdico si tiene [employer].   Esta informacin no tiene Theme park manager el consejo del mdico. Asegrese de hacerle al mdico cualquier pregunta que tenga.   Document Released: 09/04/2013 Elsevier Interactive Patient Education [employer]

## 2015-10-10 NOTE — Discharge Summary (Signed)
Physician Discharge Summary  [facility] WUJ:[id number] DOB: [birth year] DOA: 10/09/2015  PCP: No primary care provider on file.  Admit date: 10/09/2015 Discharge date: 10/10/2015  Admitted From: Home Disposition:  Home  Recommendations for Outpatient Follow-up:  1. Follow up with PCP in 2 weeks 2. Establish care with GI liver specialist in 1-2 weeks  A QUALIFIED SPANISH INTERPRETER USED TO COMMUNICATE WITH PATIENT   Discharge Condition: STABLE CODE STATUS: FULL  Brief/Interim Summary: HPI: Kelly GlassingMerced Mckinney is a non english speaking 65 y.o. female with medical history significant for cirrhosis, gastritis, htn since emergency Department chief complaint of abdominal pain and altered mental status. Initial evaluation concerning for acute hepatic encephalopathy.  Information is obtained from the patient and her family through her family as she is not speaking this. She's been in this country about 3 weeks. She reports chronic upper abdominal pain that worsened over the last 3 or 4 days. She states that she's been treated for gastritis in [place] and taking her medicine properly. She states the pain is located right upper quadrant constant describes it as an ache. Associated symptoms include generalized lethargy, increased lower extremity edema, gradual shortness of breath and gradual worsening confusion per the family. No reports of nausea vomiting. No diarrhea constipation melena bright red blood per rectum. She does have urinary incontinence and urgency. She denies dysuria hematuria. She denies chest pain palpitation headache dizziness visual disturbances.  1. Acute hepatic encephalopathy. Ammonia level 99. Total bilirubin 4.6. Patient with a history of cirrhosis reportedly. CT of the head without acute abnormality. chest x-ray with mild cardiomegaly without frank pulmonary edema. Afebrile, no leukocytosis non-toxic appearing.  He is provided with lactulose in the emergency department. Urinalysis  unremarkable.  -admit to med surg - Pt was given lactulose with rapid improvement in symptoms and ammonia levels dropped significantly.  -continue lactulose on discharge.  -obtain US abdomen - chronic findings of liver cirrhosis and mild splenomegaly. -Obtain echocardiogram outpatient  #2. Cirrhosis/abdominal pain. Hx of same.  Etiology unclear. AST slightly elevated ALT alkaline phosphatase within limits of normal. Total bilirubin 4.7. Lipase within limits of normal.  Abdomina CT with cirrhosis and portal venous HTN large splenorenal shunt and splenomegaly.  -continue home BB  -continue lactulose at discharge -continue lasix/spironolactone -follow up outpatient GI to establish care   #3. Acute kidney injury. Mild. Creatinine 1.04 on admission. Likely related to decreased oral intake. -Hold nephrotoxins, improved with gentle hydration.  -Monitor urine output   #4. Thrombocytopenia. Likely related to #2. No obvious signs symptoms bleeding.  #5. Abdominal pain/gastritis. Patient reports pain resolved on admission. Reports her PCP in [place] treating her for chronic gastritis.  -PPI to be continued at discharge  #6. Asymptomatic Bradycardia. Rate range 57-63. Likely related to panel all -Continue propanolol with parameters  #7. Pleural effusion. Small on left her CT above. Oxygen saturation levels 100% on room air  DVT prophylaxis: scd  Code Status: full  Family Communication: family at bedside  Disposition Plan: home withfamily Consults called: none  Discharge Diagnoses:  Principal Problem:   Acute hepatic encephalopathy (HCC) Active Problems:   Anemia   Acute kidney injury (HCC)   Thrombocytopenia (HCC)   Pleural effusion   Abdominal pain   Gastritis   Bradycardia   Cirrhosis (HCC)   Abdominal pain, epigastric   Increased ammonia level  Discharge Instructions     Medication List    TAKE these medications   furosemide 40 MG tablet Commonly known as:   LASIX Take  40 mg by mouth daily.   lactulose 10 GM/15ML solution Commonly known as:  CHRONULAC Take 45 mLs (30 g total) by mouth 2 (two) times daily.   Omeprazole-Sodium Bicarbonate 20-1100 MG Caps capsule Commonly known as:  ZEGERID Take 1 capsule by mouth daily before breakfast.   OVER THE COUNTER MEDICATION Take 1 tablet by mouth daily. Pulmonarom Lisados bacterianos caja con 1 oral vial. For 10 days. Then back on for 10 days   PRESCRIPTION MEDICATION Take 1 tablet by mouth every 6 (six) hours as needed (pain. ( clonixinaton) 125 mg clorthidraton 10 mg excipiente 1 tablet  ( from [place])).   PRESCRIPTION MEDICATION Take 1 tablet by mouth daily. ( Pisarpek. 500 mg 1 tablet daily.( from [place]) Levetiracetam   propranolol 10 MG tablet Commonly known as:  INDERAL Take 10 mg by mouth 2 (two) times daily.      Follow-up Information    [facility]. Schedule an appointment as soon as possible for a visit in 1 week(s).   Specialty:  Gastroenterology Why:  Establish Care for liver Disease / Cirrhosis Contact information: [address] [place] [address] [phone]       [facility]. Schedule an appointment as soon as possible for a visit in 2 week(s).   Why:  Hospital Follow Up and Establish Primary Care Contact information: [address] [address] [phone]         No Known Allergies  Procedures/Studies: Dg Chest 2 View  Result Date: 10/09/2015 CLINICAL DATA:  Cardiomegaly and pleural effusion seen on CT. EXAM: CHEST  2 VIEW COMPARISON:  CT 10/09/2015 FINDINGS: There was trace left pleural fluid on the CT and this is not appreciated on these chest radiographs. Heart size is slightly enlarged. No focal airspace disease or pulmonary edema. No acute bone abnormality. IMPRESSION: Mild cardiomegaly without focal airspace disease or frank pulmonary edema. No significant  pleural fluid. Electronically Signed   By: Richarda Overlie M.D.   On: 10/09/2015 13:46   Ct Head Wo Contrast  Result Date: 10/09/2015 CLINICAL DATA:  Confusion.  History of hypertension. EXAM: CT HEAD WITHOUT CONTRAST TECHNIQUE: Contiguous axial images were obtained from the base of the skull through the vertex without intravenous contrast. COMPARISON:  None. FINDINGS: Brain: Mild atrophy with sulcal prominence. Gray-white differentiation is maintained. No CT evidence of acute large territory infarct. No intraparenchymal or extra-axial mass or hemorrhage. Normal size and configuration of the ventricles and the basilar cisterns. No midline shift. Vascular: No hyperdense vessel or unexpected calcification. Skull: No displaced calvarial fracture. Sinuses/Orbits: Limited visualization the paranasal sinuses and mastoid air cells is normal. No air-fluid levels. Other: Regional soft tissues appear normal. IMPRESSION: Mild atrophy without acute intracranial process. Electronically Signed   By: Simonne Come M.D.   On: 10/09/2015 12:20   US Abdomen Complete  Result Date: 10/09/2015 CLINICAL DATA:  Cirrhosis. EXAM: ABDOMEN ULTRASOUND COMPLETE COMPARISON:  CT of the abdomen and pelvis from the same day. FINDINGS: Gallbladder: Surgically absent Common bile duct: Diameter: 8 mm, within normal limits following cholecystectomy. Liver: The liver is somewhat nodular. No discrete mass lesion is present. IVC: No abnormality visualized. Pancreas: Visualized portion unremarkable. Spleen: The spleen is mildly enlarged, measuring 14.1 cm in length. No focal lesions are evident. Right Kidney: Length: 10.6 cm, within normal limits. Echogenicity within normal limits. No mass or hydronephrosis visualized. Left Kidney: Length: 12.1 cm, within normal limits. Echogenicity within normal limits. No mass or  hydronephrosis visualized. Abdominal aorta: No aneurysm visualized. Other findings: None. IMPRESSION: 1. Nodularity of the liver compatible  with cirrhosis. No discrete lesions are present. 2. Mild splenomegaly likely related to the cirrhosis. 3. Cholecystectomy Electronically Signed   By: Marin Robertshristopher  Mattern M.D.   On: 10/09/2015 17:17   Ct Abdomen Pelvis W Contrast  Result Date: 10/09/2015 CLINICAL DATA:  Diffuse abdominal pain for the past 2 days. Confusion. History of fatty liver disease. EXAM: CT ABDOMEN AND PELVIS WITH CONTRAST TECHNIQUE: Multidetector CT imaging of the abdomen and pelvis was performed using the standard protocol following bolus administration of intravenous contrast. CONTRAST:  100mL ISOVUE-300 IOPAMIDOL (ISOVUE-300) INJECTION 61% COMPARISON:  None. FINDINGS: Lower chest: Limited visualization of the lower thorax demonstrates a trace left-sided pleural effusion (image 1, series 2). Minimal dependent subpleural ground-glass atelectasis. No focal airspace opacities. Cardiomegaly. Small amount of pericardial fluid, presumably physiologic. Hepatobiliary: Nodular hepatic contour compatible with cirrhosis. No discrete hepatic lesions. There is re- cannulization of the tear umbilical vein (image 20, series 2) and development of a large splenorenal shunt (coronal image 61, series 5). No definitive esophageal or gastric varices. Trace amount of perihepatic ascites (image 92, series 5). Post cholecystectomy. No intra extrahepatic bili duct dilatation. Pancreas: Normal appearance of the pancreas. Spleen: Spleen is enlarged measuring 13.5 cm in length. Adrenals/Urinary Tract: There is symmetric enhancement and excretion of the bilateral kidneys. No renal stones. No discrete renal lesions. No urine obstruction or perinephric stranding. Normal appearance of the bilateral adrenal glands. Normal appearance of the urinary bladder given degree distention. Stomach/Bowel: Nonobstructive bowel gas pattern. Normal appearance of the terminal ileum and appendix. No pneumoperitoneum, pneumatosis or portal venous gas. Vascular/Lymphatic: Normal  caliber of the abdominal aorta. The major branch vessels of the abdominal aorta appear patent on this non CTA examination. No bulky retroperitoneal, mesenteric, pelvic or inguinal lymphadenopathy. Reproductive: Normal appearance of the pelvic organs. There is a trace amount of fluid within the pelvic cul-de-sac. Other: Large amount of body wall edema. Small mesenteric fat containing periumbilical hernia. Musculoskeletal: No acute or aggressive osseous abnormalities. IMPRESSION: 1. Cirrhosis with findings of portal venous hypertension including recanalization of the periumbilical vein, development of a large splenorenal shunt and mild splenomegaly. No definitive gastric or esophageal varices. No significant intra-abdominal ascites. Given history of confusion, correlation with ammonia level is recommended. 2. Otherwise, no definite explanation for patient's abdominal pain. 3. Cardiomegaly with trace left-sided pleural effusion and rather extensive body wall edema, nonspecific though could be seen in the setting of right-sided heart failure. Clinical correlation is advised. Electronically Signed   By: Simonne ComeJohn  Watts M.D.   On: 10/09/2015 12:28    Subjective: Pt feels much better.  No confusion. Pt asking to go home.  Pt says that she will follow up as recommended and take meds as prescribed.   Discharge Exam: Vitals:   10/10/15 0423 10/10/15 0934  BP: (!) 111/91 (!) 113/48  Pulse: (!) 56 (!) 50  Resp: 16 17  Temp: 98.5 F (36.9 C) 98.8 F (37.1 C)   Vitals:   10/09/15 1821 10/09/15 2116 10/10/15 0423 10/10/15 0934  BP: 136/62 123/60 (!) 111/91 (!) 113/48  Pulse: (!) 55 92 (!) 56 (!) 50  Resp: 18 16 16 17   Temp: 98 F (36.7 C) 98.5 F (36.9 C) 98.5 F (36.9 C) 98.8 F (37.1 C)  TempSrc: Oral Oral Oral Oral  SpO2: 100% 100% 98% 96%  Weight: 78.6 kg (173 lb 3.2 oz)  78.5 kg (173 lb 1  oz)      General:  Appears calm and comfortable oriented x 3  Eyes:  PERRL, EOMI, normal lids, iris + scleral  icterus  ENT:  grossly normal hearing, lips & tongue, his membranes of her mouth are pink and moist  Neck:  no LAD, masses or thyromegaly  Cardiovascular:  RRR, no m/r/g. Trace LE edema. Venous stasis changes bilaterally  Respiratory:  CTA bilaterally, no w/r/r. Normal respiratory effort.  Abdomen:  Soft, slightly distended +BS mild tenderness upper quadrants  Skin:  no rash or induration seen on limited exam somewhat jaundiced  Musculoskeletal:  grossly normal tone BUE/BLE, good ROM, no bony abnormality  Psychiatric:  grossly normal mood and affect, slightly anxious  Neurologic:  Speech clear, facial symmetry follows commands.    The results of significant diagnostics from this hospitalization (including imaging, microbiology, ancillary and laboratory) are listed below for reference.     Microbiology: No results found for this or any previous visit (from the past 240 hour(s)).   Labs: BNP (last 3 results)  Recent Labs  10/09/15 1242  BNP 97.8   Basic Metabolic Panel:  Recent Labs Lab 10/09/15 0919 10/10/15 0335  NA 137 143  K 4.0 3.5  CL 109 112*  CO2 23 24  GLUCOSE 101* 74  BUN 17 12  CREATININE 1.04* 0.86  CALCIUM 8.8* 8.7*   Liver Function Tests:  Recent Labs Lab 10/09/15 0919  AST 51*  ALT 30  ALKPHOS 111  BILITOT 4.7*  PROT 6.5  ALBUMIN 2.5*    Recent Labs Lab 10/09/15 0919  LIPASE 65*    Recent Labs Lab 10/09/15 0919 10/10/15 0335  AMMONIA 99* 68*   CBC:  Recent Labs Lab 10/09/15 0919 10/10/15 0335  WBC 3.7* 4.0  NEUTROABS 2.0  --   HGB 10.3* 10.5*  HCT 29.3* 31.7*  MCV 96.4 99.7  PLT 67* 60*   Cardiac Enzymes:  Recent Labs Lab 10/09/15 1732  CKTOTAL 157   BNP: Invalid input(s): POCBNP CBG: No results for input(s): GLUCAP in the last 168 hours. D-Dimer No results for input(s): DDIMER in the last 72 hours. Hgb A1c No results for input(s): HGBA1C in the last 72 hours. Lipid Profile No results for input(s):  CHOL, HDL, LDLCALC, TRIG, CHOLHDL, LDLDIRECT in the last 72 hours. Thyroid function studies No results for input(s): TSH, T4TOTAL, T3FREE, THYROIDAB in the last 72 hours.  Invalid input(s): FREET3 Anemia work up No results for input(s): VITAMINB12, FOLATE, FERRITIN, TIBC, IRON, RETICCTPCT in the last 72 hours. Urinalysis    Component Value Date/Time   COLORURINE AMBER (A) 10/09/2015 1023   APPEARANCEUR CLOUDY (A) 10/09/2015 1023   LABSPEC 1.012 10/09/2015 1023   PHURINE 7.0 10/09/2015 1023   GLUCOSEU NEGATIVE 10/09/2015 1023   HGBUR LARGE (A) 10/09/2015 1023   BILIRUBINUR SMALL (A) 10/09/2015 1023   KETONESUR NEGATIVE 10/09/2015 1023   PROTEINUR NEGATIVE 10/09/2015 1023   NITRITE NEGATIVE 10/09/2015 1023   LEUKOCYTESUR SMALL (A) 10/09/2015 1023   Sepsis Labs Invalid input(s): PROCALCITONIN,  WBC,  LACTICIDVEN Microbiology No results found for this or any previous visit (from the past 240 hour(s)).   Time coordinating discharge: 29 mins SIGNED: Standley Dakins, MD  [facility] 10/10/2015, 12:19 PM Pager   If 7PM-7AM, please contact night-coverage [email or web address] Password [email or web address]

## 2015-10-11 LAB — URINE CULTURE

## 2015-10-12 ENCOUNTER — Encounter: Payer: Self-pay | Admitting: Physician Assistant

## 2015-10-21 ENCOUNTER — Emergency Department (HOSPITAL_COMMUNITY)
Admission: EM | Admit: 2015-10-21 | Discharge: 2015-10-22 | Disposition: A | Discharge status: Home / Self Care | Payer: Self-pay | Attending: Emergency Medicine | Admitting: Emergency Medicine

## 2015-10-21 ENCOUNTER — Encounter (HOSPITAL_COMMUNITY): Payer: Self-pay | Admitting: Emergency Medicine

## 2015-10-21 DIAGNOSIS — K2970 Gastritis, unspecified, without bleeding: Secondary | ICD-10-CM | POA: Insufficient documentation

## 2015-10-21 DIAGNOSIS — I10 Essential (primary) hypertension: Secondary | ICD-10-CM | POA: Insufficient documentation

## 2015-10-21 DIAGNOSIS — K7030 Alcoholic cirrhosis of liver without ascites: Secondary | ICD-10-CM | POA: Insufficient documentation

## 2015-10-21 DIAGNOSIS — Z79899 Other long term (current) drug therapy: Secondary | ICD-10-CM | POA: Insufficient documentation

## 2015-10-21 DIAGNOSIS — K7460 Unspecified cirrhosis of liver: Secondary | ICD-10-CM

## 2015-10-21 LAB — CBC
HCT: 31.7 % — ABNORMAL LOW (ref 36.0–46.0)
Hemoglobin: 10.5 g/dL — ABNORMAL LOW (ref 12.0–15.0)
MCH: 33 pg (ref 26.0–34.0)
MCHC: 33.1 g/dL (ref 30.0–36.0)
MCV: 99.7 fL (ref 78.0–100.0)
PLATELETS: 59 10*3/uL — AB (ref 150–400)
RBC: 3.18 MIL/uL — ABNORMAL LOW (ref 3.87–5.11)
RDW: 15.8 % — ABNORMAL HIGH (ref 11.5–15.5)
WBC: 3.9 10*3/uL — ABNORMAL LOW (ref 4.0–10.5)

## 2015-10-21 LAB — URINALYSIS, ROUTINE W REFLEX MICROSCOPIC
BILIRUBIN URINE: NEGATIVE
Glucose, UA: NEGATIVE mg/dL
KETONES UR: NEGATIVE mg/dL
Leukocytes, UA: NEGATIVE
Nitrite: NEGATIVE
PROTEIN: NEGATIVE mg/dL
Specific Gravity, Urine: 1.009 (ref 1.005–1.030)
pH: 7.5 (ref 5.0–8.0)

## 2015-10-21 LAB — URINE MICROSCOPIC-ADD ON: WBC, UA: NONE SEEN WBC/hpf (ref 0–5)

## 2015-10-21 LAB — COMPREHENSIVE METABOLIC PANEL
ALBUMIN: 2.6 g/dL — AB (ref 3.5–5.0)
ALK PHOS: 107 U/L (ref 38–126)
ALT: 30 U/L (ref 14–54)
ANION GAP: 5 (ref 5–15)
AST: 51 U/L — ABNORMAL HIGH (ref 15–41)
BUN: 15 mg/dL (ref 6–20)
CALCIUM: 9.1 mg/dL (ref 8.9–10.3)
CHLORIDE: 106 mmol/L (ref 101–111)
CO2: 28 mmol/L (ref 22–32)
Creatinine, Ser: 0.78 mg/dL (ref 0.44–1.00)
GFR calc Af Amer: >60 mL/min (ref >60)
GFR calc non Af Amer: >60 mL/min (ref >60)
GLUCOSE: 105 mg/dL — AB (ref 65–99)
Potassium: 4.2 mmol/L (ref 3.5–5.1)
SODIUM: 139 mmol/L (ref 135–145)
Total Bilirubin: 4.3 mg/dL — ABNORMAL HIGH (ref 0.3–1.2)
Total Protein: 6.5 g/dL (ref 6.5–8.1)

## 2015-10-21 LAB — LIPASE, BLOOD: Lipase: 54 U/L — ABNORMAL HIGH (ref 11–51)

## 2015-10-21 NOTE — ED Triage Notes (Signed)
Patient with abdominal pain for the last week.  Patient increasing in pain.  Patient seen here on 10/09/2015 for same.  Continues with pain, no nausea and no vomiting.  Has an appt with GI on Sept 5th.

## 2015-10-22 ENCOUNTER — Emergency Department (HOSPITAL_COMMUNITY): Payer: Self-pay

## 2015-10-22 LAB — TROPONIN I: Troponin I: <0.03 ng/mL (ref <0.03)

## 2015-10-22 LAB — PROTIME-INR
INR: 1.79
Prothrombin Time: 21 seconds — ABNORMAL HIGH (ref 11.4–15.2)

## 2015-10-22 MED ORDER — ONDANSETRON 4 MG PO TBDP: 4.0000 mg | ORAL_TABLET | Freq: Three times a day (TID) | ORAL | 0 refills | Status: DC | PRN

## 2015-10-22 MED ORDER — PANTOPRAZOLE SODIUM 40 MG PO TBEC
80.0000 mg | DELAYED_RELEASE_TABLET | Freq: Once | ORAL | Status: AC
  Administered 2015-10-22: 80 mg via ORAL
  Filled 2015-10-22: qty 2

## 2015-10-22 MED ORDER — SUCRALFATE 1 GM/10ML PO SUSP: 1.0000 g | Freq: Three times a day (TID) | ORAL | 0 refills | Status: DC

## 2015-10-22 MED ORDER — GI COCKTAIL ~~LOC~~
30.0000 mL | Freq: Once | ORAL | Status: AC
  Administered 2015-10-22: 30 mL via ORAL
  Filled 2015-10-22: qty 30

## 2015-10-22 MED ORDER — ONDANSETRON 4 MG PO TBDP
4.0000 mg | ORAL_TABLET | Freq: Once | ORAL | Status: AC
  Administered 2015-10-22: 4 mg via ORAL
  Filled 2015-10-22: qty 1

## 2015-10-22 MED ORDER — OMEPRAZOLE 40 MG PO CPDR: 40.0000 mg | DELAYED_RELEASE_CAPSULE | Freq: Every day | ORAL | 1 refills | Status: AC

## 2015-10-22 NOTE — ED Provider Notes (Signed)
By signing my name below, I, Emmanuella Mensah, attest that this documentation has been prepared under the direction and in the presence of Kristen N Ward, DO. Electronically Signed: Angelene Giovanni, ED Scribe. 10/22/15. 1:03 AM.   TIME SEEN: 12:31 AM. Spanish phone interpreter used.   CHIEF COMPLAINT: Abdominal Pain   HPI:  Kelly Mckinney is a 65 y.o. female with a hx of cirrhosis, gastritis, hypertension, hepatic encephalopathy who presents to the Emergency Department complaining of acute onset of ongoing intermittent burning epigastric pain onset one week ago. She notes that she has been having these episodes for 2 years. She adds that the pain is worse after eating, especially after eating spicy foods. No alleviating factors. She states that she is currently on one tablet of Omeprazole daily, which has been helpful. She denies any ETOH use.  No NSAID use. Pt has a hx of cholecystectomy.  She has been in the [place] for 5 weeks, visiting from [place]. Pt was seen on 10/09/15 for these symptoms where she was admitted for hepatic encephalopathy. No confusion currently. She denies any fever, chills, chest pain, SOB, n/v/d, blood in stool, or melena. Pt has upcoming GI appointment on 10/27/15.    ROS: See HPI Constitutional: no fever  Eyes: no drainage  ENT: no runny nose   Cardiovascular:  no chest pain  Resp: no SOB  GI: no vomiting GU: no dysuria Integumentary: no rash  Allergy: no hives  Musculoskeletal: no leg swelling  Neurological: no slurred speech ROS otherwise negative  PAST MEDICAL HISTORY/PAST SURGICAL HISTORY:  Past Medical History:  Diagnosis Date  . Cirrhosis (HCC)   . Gastritis   . Hypertension     MEDICATIONS:  Prior to Admission medications   Medication Sig Start Date End Date Taking? Authorizing Provider  furosemide (LASIX) 40 MG tablet Take 40 mg by mouth daily.    Historical Provider, MD  lactulose (CHRONULAC) 10 GM/15ML solution Take 45 mLs (30 g total) by mouth 2  (two) times daily. 10/10/15   Clanford Cyndie Mull, MD  Omeprazole-Sodium Bicarbonate (ZEGERID) 20-1100 MG CAPS capsule Take 1 capsule by mouth daily before breakfast.    Historical Provider, MD  OVER THE COUNTER MEDICATION Take 1 tablet by mouth daily. Pulmonarom Lisados bacterianos caja con 1 oral vial. For 10 days. Then back on for 10 days    Historical Provider, MD  PRESCRIPTION MEDICATION Take 1 tablet by mouth every 6 (six) hours as needed (pain. ( clonixinaton) 125 mg clorthidraton 10 mg excipiente 1 tablet  ( from [place])).    Historical Provider, MD  PRESCRIPTION MEDICATION Take 1 tablet by mouth daily. ( Pisarpek. 500 mg 1 tablet daily.( from [place]) Levetiracetam    Historical Provider, MD  propranolol (INDERAL) 10 MG tablet Take 10 mg by mouth 2 (two) times daily.    Historical Provider, MD    ALLERGIES:  No Known Allergies  SOCIAL HISTORY:  Social History  Substance Use Topics  . Smoking status: Never Smoker  . Smokeless tobacco: Never Used  . Alcohol use Not on file    FAMILY HISTORY: History reviewed. No pertinent family history.  EXAM: BP 111/55   Pulse (!) 54   Temp 98.6 F (37 C) (Oral)   Resp 16   Wt 173 lb (78.5 kg)   SpO2 96%  CONSTITUTIONAL: Alert and oriented and responds appropriately to questions. Well-nourished. Chronically-ill appearing, NAD. Afebrile, non-toxic  HEAD: Normocephalic EYES: Conjunctivae clear, PERRL ENT: normal nose; no rhinorrhea; moist mucous membranes NECK: Supple,  no meningismus, no LAD  CARD: RRR; S1 and S2 appreciated; no murmurs, no clicks, no rubs, no gallops RESP: Normal chest excursion without splinting or tachypnea; breath sounds clear and equal bilaterally; no wheezes, no rhonchi, no rales, no hypoxia or respiratory distress, speaking full sentences ABD/GI: Normal bowel sounds; non-distended; soft, TTP epigastric region, no rebound, no guarding, no peritoneal signs, No tenderness at McBurney's point BACK:  The back appears  normal and is non-tender to palpation, there is no CVA tenderness EXT: Normal ROM in all joints; non-tender to palpation; no edema; normal capillary refill; no cyanosis, no calf tenderness or swelling    SKIN: Normal color for age and race; warm; no rash NEURO: Moves all extremities equally, sensation to light touch intact diffusely, cranial nerves II through XII intact PSYCH: The patient's mood and manner are appropriate. Grooming and personal hygiene are appropriate.  MEDICAL DECISION MAKING: Patient here with epigastric pain. Has been told she has gastritis and states this is similar. No bloody stools, melena. No fluid wave on exam or distention to suggest ascites. Doubt SBP. She is afebrile and nontoxic appearing. Labs ordered in triage appeared to be at their baseline. We'll add on troponin, chest x-ray as well. We'll give GI cocktail, Protonix and reassess.  ED PROGRESS: EKG, troponin, chest x-ray unremarkable. Patient is now pain-free after above medications. We'll have her increase her omeprazole to 40 mg once a day from 20 mg. She has outpatient GI follow-up scheduled. Have advised them to avoid spicy, greasy, acidic foods. Recommended she avoid NSAIDs, Tylenol, alcohol.  Her daughter in law at bedside is concerned because she is on multiple medications for [place] but they are not sure of the dose or how frequently she should be taking them. Have advised them to call the physician but is still following her in [place] for further clarification. They do state the patient plans to go back to [place] in the next several months.   At this time, I do not feel there is any life-threatening condition present. I have reviewed and discussed all results (EKG, imaging, lab, urine as appropriate), exam findings with patient/family. I have reviewed nursing notes and appropriate previous records.  I feel the patient is safe to be discharged home without further emergent workup and can continue workup as an  outpatient as needed. Discussed usual and customary return precautions. Patient/family verbalize understanding and are comfortable with this plan.  Outpatient follow-up has been provided. All questions have been answered.      EKG Interpretation  Date/Time:  Thursday October 22 2015 02:20:56 EDT Ventricular Rate:  54 PR Interval:    QRS Duration: 98 QT Interval:  509 QTC Calculation: 483 R Axis:   49 Text Interpretation:  Sinus rhythm No significant change since last tracing Confirmed by WARD,  DO, KRISTEN [phone]([id number]) on 10/22/2015 2:38:42 AM        I personally performed the services described in this documentation, which was scribed in my presence. The recorded information has been reviewed and is accurate.    Layla MawKristen N Ward, DO 10/22/15 [phone]

## 2015-10-23 DIAGNOSIS — K922 Gastrointestinal hemorrhage, unspecified: Secondary | ICD-10-CM

## 2015-10-23 HISTORY — DX: Gastrointestinal hemorrhage, unspecified: K92.2

## 2015-10-25 ENCOUNTER — Encounter (HOSPITAL_COMMUNITY): Payer: Self-pay | Admitting: *Deleted

## 2015-10-25 ENCOUNTER — Emergency Department (HOSPITAL_COMMUNITY)
Admission: EM | Admit: 2015-10-25 | Discharge: 2015-10-26 | Disposition: A | Discharge status: Home / Self Care | Payer: Self-pay | Attending: Emergency Medicine | Admitting: Emergency Medicine

## 2015-10-25 DIAGNOSIS — Z79899 Other long term (current) drug therapy: Secondary | ICD-10-CM | POA: Insufficient documentation

## 2015-10-25 DIAGNOSIS — K922 Gastrointestinal hemorrhage, unspecified: Secondary | ICD-10-CM | POA: Insufficient documentation

## 2015-10-25 DIAGNOSIS — I10 Essential (primary) hypertension: Secondary | ICD-10-CM | POA: Insufficient documentation

## 2015-10-25 LAB — COMPREHENSIVE METABOLIC PANEL
ALBUMIN: 2.5 g/dL — AB (ref 3.5–5.0)
ALK PHOS: 112 U/L (ref 38–126)
ALT: 29 U/L (ref 14–54)
ANION GAP: 4 — AB (ref 5–15)
AST: 51 U/L — AB (ref 15–41)
BILIRUBIN TOTAL: 3.3 mg/dL — AB (ref 0.3–1.2)
BUN: 12 mg/dL (ref 6–20)
CALCIUM: 8.6 mg/dL — AB (ref 8.9–10.3)
CO2: 25 mmol/L (ref 22–32)
CREATININE: 0.81 mg/dL (ref 0.44–1.00)
Chloride: 110 mmol/L (ref 101–111)
GFR calc Af Amer: >60 mL/min (ref >60)
GFR calc non Af Amer: >60 mL/min (ref >60)
GLUCOSE: 132 mg/dL — AB (ref 65–99)
Potassium: 4.2 mmol/L (ref 3.5–5.1)
Sodium: 139 mmol/L (ref 135–145)
TOTAL PROTEIN: 6.5 g/dL (ref 6.5–8.1)

## 2015-10-25 LAB — CBC
HCT: 31.1 % — ABNORMAL LOW (ref 36.0–46.0)
Hemoglobin: 10.3 g/dL — ABNORMAL LOW (ref 12.0–15.0)
MCH: 33.1 pg (ref 26.0–34.0)
MCHC: 33.1 g/dL (ref 30.0–36.0)
MCV: 100 fL (ref 78.0–100.0)
PLATELETS: 58 10*3/uL — AB (ref 150–400)
RBC: 3.11 MIL/uL — ABNORMAL LOW (ref 3.87–5.11)
RDW: 15.5 % (ref 11.5–15.5)
WBC: 3.6 10*3/uL — ABNORMAL LOW (ref 4.0–10.5)

## 2015-10-25 LAB — LIPASE, BLOOD: Lipase: 70 U/L — ABNORMAL HIGH (ref 11–51)

## 2015-10-25 NOTE — ED Provider Notes (Signed)
[facility] DEPT Provider Note   CSN: [id number] Arrival date & time: 10/25/15  2214  By signing my name below, I, Rosario AdieWilliam Andrew Hiatt, attest that this documentation has been prepared under the direction and in the presence of Tomasita CrumbleAdeleke Tobby Fawcett, MD. Electronically Signed: Rosario AdieWilliam Andrew Hiatt, ED Scribe. 10/25/15. 12:07 AM.  History   Chief Complaint Chief Complaint  Patient presents with  . Abdominal Pain   The history is provided by the patient, medical records and a relative. A language interpreter was used (Spanish, daughter at bedside).   HPI Comments: Kelly Mckinney is a 65 y.o. female with a PMHx of cirrhosis and gastritis, who presents to the Emergency Department complaining of gradual onset, constant diffuse abdominal pain onset earlier today PTA. Per daughter, pt has associated dark red bloody stools x 1 episode, nausea, vomiting x 2 episodes, and intermittent episodes of sensation of palpitations since the onset of her pain today. Pt was seen ~3 days PTA for same, and at that time had a negative workup. Pt has a hx of chronic abdominal pain, which she states is similar to her pain today. No recent EtOH or NSAID use. She has a PSHx of a cholecystectomy. Denies fever, hematemesis, or any other associated symptoms.   Past Medical History:  Diagnosis Date  . Cirrhosis (HCC)   . Gastritis   . Hypertension    Patient Active Problem List   Diagnosis Date Noted  . Hepatic encephalopathy (HCC) 10/09/2015  . Acute hepatic encephalopathy (HCC) 10/09/2015  . Anemia 10/09/2015  . Acute kidney injury (HCC) 10/09/2015  . Thrombocytopenia (HCC) 10/09/2015  . Pleural effusion 10/09/2015  . Abdominal pain 10/09/2015  . Gastritis 10/09/2015  . Bradycardia 10/09/2015  . Cirrhosis (HCC)   . Abdominal pain, epigastric   . Increased ammonia level    History reviewed. No pertinent surgical history.  OB History    No data available     Home Medications    Prior to Admission medications    Medication Sig Start Date End Date Taking? Authorizing Provider  furosemide (LASIX) 40 MG tablet Take 40 mg by mouth daily.   Yes Historical Provider, MD  levETIRAcetam (KEPPRA) 500 MG tablet Take 500 mg by mouth daily.   Yes Historical Provider, MD  NON FORMULARY Take 1 packet by mouth daily. Hepa-Merz  L-Ornitina L-Aspartato 3g   Yes Historical Provider, MD  omeprazole (PRILOSEC) 40 MG capsule Take 1 capsule (40 mg total) by mouth daily. 10/22/15  Yes Kristen N Ward, DO  ondansetron (ZOFRAN ODT) 4 MG disintegrating tablet Take 1 tablet (4 mg total) by mouth every 8 (eight) hours as needed for nausea or vomiting. 10/22/15  Yes Kristen N Ward, DO  PRESCRIPTION MEDICATION Take 1 tablet by mouth every 6 (six) hours as needed (pain. ( clonixinaton) 125 mg clorthidraton 10 mg excipiente 1 tablet  ( from [place]   Yes Historical Provider, MD  propranolol (INDERAL) 10 MG tablet Take 10 mg by mouth 2 (two) times daily.   Yes Historical Provider, MD  spironolactone (ALDACTONE) 25 MG tablet Take 25 mg by mouth daily.   Yes Historical Provider, MD  sucralfate (CARAFATE) 1 GM/10ML suspension Take 10 mLs (1 g total) by mouth 4 (four) times daily -  with meals and at bedtime. 10/22/15  Yes Kristen N Ward, DO   Family History No family history on file.  Social History Social History  Substance Use Topics  . Smoking status: Never Smoker  . Smokeless tobacco: Never Used  .  Alcohol use No   Allergies   Review of patient's allergies indicates no known allergies.  Review of Systems Review of Systems A complete 10 system review of systems was obtained and all systems are negative except as noted in the HPI and PMH.   Physical Exam Updated Vital Signs BP 120/66   Pulse 61   Temp 98.7 F (37.1 C) (Oral)   Resp 14   SpO2 99%   Physical Exam  Constitutional: She is oriented to person, place, and time. She appears well-developed and well-nourished. No distress.  HENT:  Head: Normocephalic and  atraumatic.  Nose: Nose normal.  Mouth/Throat: Oropharynx is clear and moist. No oropharyngeal exudate.  Eyes: Conjunctivae and EOM are normal. Pupils are equal, round, and reactive to light. No scleral icterus.  Neck: Normal range of motion. Neck supple. No JVD present. No tracheal deviation present. No thyromegaly present.  Cardiovascular: Normal rate, regular rhythm and normal heart sounds.  Exam reveals no gallop and no friction rub.   No murmur heard. Pulmonary/Chest: Effort normal and breath sounds normal. No respiratory distress. She has no wheezes. She exhibits no tenderness.  Abdominal: Soft. Bowel sounds are normal. She exhibits no distension and no mass. There is tenderness. There is no rebound and no guarding.  Very mild diffuse abdominal TTP.   Musculoskeletal: Normal range of motion. She exhibits no edema or tenderness.  Lymphadenopathy:    She has no cervical adenopathy.  Neurological: She is alert and oriented to person, place, and time. No cranial nerve deficit. She exhibits normal muscle tone.  Skin: Skin is warm and dry. No rash noted. No erythema. No pallor.  Nursing note and vitals reviewed.  ED Treatments / Results  DIAGNOSTIC STUDIES: Oxygen Saturation is 100% on RA, normal by my interpretation.   COORDINATION OF CARE: 11:54 PM-Discussed next steps with pt. Pt verbalized understanding and is agreeable with the plan.   Labs (all labs ordered are listed, but only abnormal results are displayed) Labs Reviewed  LIPASE, BLOOD - Abnormal; Notable for the following:       Result Value   Lipase 70 (*)    All other components within normal limits  COMPREHENSIVE METABOLIC PANEL - Abnormal; Notable for the following:    Glucose, Bld 132 (*)    Calcium 8.6 (*)    Albumin 2.5 (*)    AST 51 (*)    Total Bilirubin 3.3 (*)    Anion gap 4 (*)    All other components within normal limits  CBC - Abnormal; Notable for the following:    WBC 3.6 (*)    RBC 3.11 (*)     Hemoglobin 10.3 (*)    HCT 31.1 (*)    Platelets 58 (*)    All other components within normal limits  URINALYSIS, ROUTINE W REFLEX MICROSCOPIC (NOT AT [facility]) - Abnormal; Notable for the following:    Color, Urine ORANGE (*)    APPearance CLOUDY (*)    Hgb urine dipstick LARGE (*)    Bilirubin Urine SMALL (*)    Ketones, ur 15 (*)    Leukocytes, UA SMALL (*)    All other components within normal limits  PROTIME-INR - Abnormal; Notable for the following:    Prothrombin Time 20.6 (*)    All other components within normal limits  URINE MICROSCOPIC-ADD ON - Abnormal; Notable for the following:    Squamous Epithelial / LPF 6-30 (*)    Bacteria, UA MANY (*)    All  other components within normal limits  POC OCCULT BLOOD, ED - Abnormal; Notable for the following:    Fecal Occult Bld POSITIVE (*)    All other components within normal limits   EKG  EKG Interpretation None      Radiology Ct Abdomen Pelvis W Contrast  Result Date: 10/26/2015 CLINICAL DATA:  Acute onset of generalized abdominal pain and vomiting. Dark blood in stool. Initial encounter. EXAM: CT ABDOMEN AND PELVIS WITH CONTRAST TECHNIQUE: Multidetector CT imaging of the abdomen and pelvis was performed using the standard protocol following bolus administration of intravenous contrast. CONTRAST:  100 mL ISOVUE-300 IOPAMIDOL (ISOVUE-300) INJECTION 61% COMPARISON:  Abdominal ultrasound, and CT of the abdomen and pelvis performed 10/09/2015 FINDINGS: The visualized lung bases are clear. Trace pericardial fluid remains within normal limits. The mildly nodular contour of the liver raises question for hepatic cirrhosis. The spleen is unremarkable in appearance. The patient appears to be status post cholecystectomy. The pancreas and adrenal glands are unremarkable. The kidneys are unremarkable in appearance. There is no evidence of hydronephrosis. No renal or ureteral stones are seen. No perinephric stranding is appreciated. The small bowel  is unremarkable in appearance. The stomach is within normal limits. No acute vascular abnormalities are seen. The appendix is normal in caliber, without evidence of appendicitis. The colon is grossly unremarkable in appearance. The bladder is mildly distended and grossly unremarkable. The uterus is unremarkable in appearance. Trace free fluid within the pelvis is likely physiologic in nature. No inguinal lymphadenopathy is seen. Soft tissue inflammation is noted along the anterior abdominal wall. No acute osseous abnormalities are identified. IMPRESSION: 1. No acute abnormality seen within the abdomen or pelvis. 2. Soft tissue inflammation along the anterior abdominal wall. 3. Mildly nodular contour of the liver raises question for mild hepatic cirrhosis. Electronically Signed   By: Roanna Raider M.D.   On: 10/26/2015 02:00    Procedures Procedures (including critical care time)  Medications Ordered in ED Medications  iopamidol (ISOVUE-300) 61 % injection (100 mLs  Contrast Given 10/26/15 0129)    Initial Impression / Assessment and Plan / ED Course  I have reviewed the triage vital signs and the nursing notes.  Pertinent labs & imaging results that were available during my care of the patient were reviewed by me and considered in my medical decision making (see chart for details).  Clinical Course    Patient presents to the ED for abdominal pain with history of cirrhosis.  She does not appear to have any ascites on exam.  Will check hemoccult for possible GI bleed.  And get CT scan for further evaluation.  2:17 AM hemoccult is positive, but hgb is normal and there was no gross blood on my finger.  CT scan is neg for acute pathology.  Patient advised to follow up closely with her physician.  She appears well and in NAD.  Vs remain stable and she has not had any bloody stool in the ED.  Daughter is a Engineer, civil (consulting) and will be closely looking after her.  Patient safe for DC.  Final Clinical  Impressions(s) / ED Diagnoses   Final diagnoses:  None    New Prescriptions New Prescriptions   No medications on file    I personally performed the services described in this documentation, which was scribed in my presence. The recorded information has been reviewed and is accurate.      Tomasita Crumble, MD 10/26/15 [phone]

## 2015-10-25 NOTE — ED Triage Notes (Signed)
The pt is c/o abd pain with vomiting and had a one time dark blood in stool hx of cirrhosis from [place]

## 2015-10-26 ENCOUNTER — Emergency Department (HOSPITAL_COMMUNITY): Payer: Self-pay

## 2015-10-26 LAB — URINALYSIS, ROUTINE W REFLEX MICROSCOPIC
Glucose, UA: NEGATIVE mg/dL
KETONES UR: 15 mg/dL — AB
NITRITE: NEGATIVE
Protein, ur: NEGATIVE mg/dL
Specific Gravity, Urine: 1.019 (ref 1.005–1.030)
pH: 6.5 (ref 5.0–8.0)

## 2015-10-26 LAB — URINE MICROSCOPIC-ADD ON

## 2015-10-26 LAB — POC OCCULT BLOOD, ED: Fecal Occult Bld: POSITIVE — AB

## 2015-10-26 LAB — PROTIME-INR
INR: 1.74
PROTHROMBIN TIME: 20.6 s — AB (ref 11.4–15.2)

## 2015-10-26 MED ORDER — IOPAMIDOL (ISOVUE-300) INJECTION 61%
INTRAVENOUS | Status: AC
  Administered 2015-10-26: 100 mL
  Filled 2015-10-26: qty 100

## 2015-10-26 MED ORDER — DICYCLOMINE HCL 10 MG PO CAPS
20.0000 mg | ORAL_CAPSULE | Freq: Once | ORAL | Status: AC
Start: 2015-10-26 — End: 2015-10-26
  Administered 2015-10-26: 20 mg via ORAL
  Filled 2015-10-26: qty 2

## 2015-10-26 MED ORDER — DICYCLOMINE HCL 10 MG PO CAPS: 10.0000 mg | ORAL_CAPSULE | Freq: Three times a day (TID) | ORAL | 0 refills | Status: DC | PRN

## 2015-10-27 ENCOUNTER — Ambulatory Visit (INDEPENDENT_AMBULATORY_CARE_PROVIDER_SITE_OTHER): Payer: Self-pay | Admitting: Physician Assistant

## 2015-10-27 ENCOUNTER — Other Ambulatory Visit (INDEPENDENT_AMBULATORY_CARE_PROVIDER_SITE_OTHER): Payer: Self-pay

## 2015-10-27 ENCOUNTER — Encounter: Payer: Self-pay | Admitting: Physician Assistant

## 2015-10-27 VITALS — BP 90/58 | HR 52 | Ht 60.0 in | Wt 170.0 lb

## 2015-10-27 DIAGNOSIS — G9340 Encephalopathy, unspecified: Secondary | ICD-10-CM

## 2015-10-27 DIAGNOSIS — K7460 Unspecified cirrhosis of liver: Secondary | ICD-10-CM

## 2015-10-27 LAB — AMMONIA: AMMONIA: 92 umol/L — AB (ref 11–35)

## 2015-10-27 MED ORDER — SUCRALFATE 1 GM/10ML PO SUSP: 1.0000 g | Freq: Three times a day (TID) | ORAL | 1 refills | Status: AC

## 2015-10-27 MED ORDER — LACTULOSE 10 GM/15ML PO SOLN: 30.0000 g | Freq: Two times a day (BID) | ORAL | 6 refills | Status: DC

## 2015-10-27 NOTE — Progress Notes (Signed)
Subjective:    Patient ID: Kelly Mckinney, female    DOB: [birth year], 65 y.o.   MRN: [id number]  HPI Kelly Mckinney  is a 65 year old non-English-speaking Hispanic female who is visiting her family here and resides in [place]. She is self-referred, appointment was made by her daughter-in-law who is a Engineer, civil (consulting) at [employer]. Patient has a diagnosis of decompensated cirrhosis, presumably NASH,and encephalopathy. She was hospitalized at [facility] through 10/10/2015 with change in her mental status, she was found to have an elevated ammonia level, started on lactulose and quickly improved. She also had an ER visit this past weekend on 10/25/2015 due to complaints of abdominal pain and some blood in her stool. CT was done showing a cirrhotic-appearing liver, status post cholecystectomy and there was question of soft tissue inflammation along the anterior abdominal wall ,no ascites. Labs showed a lipase of 70 total bili 3.3 AST 51 leaflets 58 ProTime 20.6 INR of 1.7 UA was positive for many bacteria and she was also Hemoccult positive. Patient's family states that she has a physician she sees in [place] who has not a specialist. She has been diagnosed with cirrhosis about a year and a half ago and has been told that it was due to fatty liver. So some question about medication that she had been on in  the past, possibly contributing. There is no family history of liver disease that they are aware of she does not consume alcohol. Hepatitis serologies were negative with recent hospitalization. She is also had previous problems with encephalopathy and had been on a medication at home to help control this which they said did not work very well. Over the past 3-4 days she has been out of lactulose order an office that she is then more forgetful. She's also been having difficulty sleeping. Etiology of her abdominal pain is not clear her daughter-in-law wonders whether this may be anxiety related. He now tells me that  they have not noticed any overt blood in her stool. 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 This has not helped with her abdominal pain.  Review of Systems Pertinent positive and negative review of systems were noted in the above HPI section.  All other review of systems was otherwise negative.  Outpatient Encounter Prescriptions as of 10/27/2015  Medication Sig  . dicyclomine (BENTYL) 10 MG capsule Take 1 capsule (10 mg total) by mouth 3 (three) times daily as needed for spasms.  . furosemide (LASIX) 40 MG tablet Take 40 mg by mouth daily.  Marland Kitchen levETIRAcetam (KEPPRA) 500 MG tablet Take 500 mg by mouth daily.  Marland Kitchen omeprazole (PRILOSEC) 40 MG capsule Take 1 capsule (40 mg total) by mouth daily.  . propranolol (INDERAL) 10 MG tablet Take 10 mg by mouth 2 (two) times daily.  Marland Kitchen spironolactone (ALDACTONE) 25 MG tablet Take 25 mg by mouth daily.  . sucralfate (CARAFATE) 1 GM/10ML suspension Take 10 mLs (1 g total) by mouth 4 (four) times daily -  with meals and at bedtime.  . [DISCONTINUED] NON FORMULARY Take 1 packet by mouth daily. Hepa-Merz  L-Ornitina L-Aspartato 3g  . [DISCONTINUED] sucralfate (CARAFATE) 1 GM/10ML suspension Take 10 mLs (1 g total) by mouth 4 (four) times daily -  with meals and at bedtime.  Marland Kitchen lactulose (CHRONULAC) 10 GM/15ML solution Take 45 mLs (  30 g total) by mouth 2 (two) times daily.  . [DISCONTINUED] ondansetron (ZOFRAN ODT) 4 MG disintegrating tablet Take 1 tablet (4 mg total) by mouth every 8 (eight) hours as needed for nausea or vomiting.  . [DISCONTINUED] PRESCRIPTION MEDICATION Take 1 tablet by mouth every 6 (six) hours as needed (pain. ( clonixinaton) 125 mg clorthidraton 10 mg excipiente 1 tablet  ( from [place])).   No facility-administered encounter medications on file as of 10/27/2015.    No Known  Allergies Patient Active Problem List   Diagnosis Date Noted  . Hepatic encephalopathy (HCC) 10/09/2015  . Acute hepatic encephalopathy (HCC) 10/09/2015  . Anemia 10/09/2015  . Acute kidney injury (HCC) 10/09/2015  . Thrombocytopenia (HCC) 10/09/2015  . Pleural effusion 10/09/2015  . Abdominal pain 10/09/2015  . Gastritis 10/09/2015  . Bradycardia 10/09/2015  . Cirrhosis (HCC)   . Abdominal pain, epigastric   . Increased ammonia level    Social History   Social History  . Marital status: Married    Spouse name: N/A  . Number of children: N/A  . Years of education: N/A   Occupational History  . Not on file.   Social History Main Topics  . Smoking status: Never Smoker  . Smokeless tobacco: Never Used  . Alcohol use No  . Drug use: Unknown  . Sexual activity: Not on file   Other Topics Concern  . Not on file   Social History Narrative  . No narrative on file    Ms. Kelly Mckinney's family history is not on file.      Objective:    Vitals:   10/27/15 0928  BP: (!) 90/58  Pulse: (!) 52    Physical Exam well-developed older Hispanic female in no acute distress, accompanied by her daughter and daughter-in-law who  interpret  Patient is non-English speaking. Blood pressure 90/58 pulse 52, height 5 foot weight 170 BMI 33.2. HEENT; nontraumatic normocephalic EOMI PERRLA sclera anicteric, Cardiovascular; regular rate and rhythm with S1-S2 no murmur or gallop, Pulmonary; clear bilaterally, Abdomen; soft, no appreciable ascites or fluid wave no focal tenderness no guarding or rebound bowel sounds are present no palpable mass or hepatosplenomegaly, Rectal; exam not done, Extremities; 2+ edema in the shins and ankles, Neuropsych; mood and affect appropriate there is no asterixis       Assessment & Plan:   #[age] -year-old  [place] female, non-English speaking who is visiting here from [place] with a diagnosis of decompensated cirrhosis, 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 #2 hepatic  encephalopathy-chronic brief hospitalization 2 weeks ago #3 abdominal pain-again this sounds chronic with previous diagnosis of "abdominal epilepsy" for which she is on Keppra-CT done 2 days ago with no acute findings #4 coagulopathy #5 thrombocytopenia #6 Hemoccult-positive stool #7 status post cholecystectomy #8 probable UTI with abnormal UA -ER visit 10/25/2015  Plan;; we'll check venous ammonia today and alpha-fetoprotein, urine culture Refill lactulose, patient to be on 45 mL by mouth twice daily She will continue her current dosage of Lasix 40 mg by mouth every morning Continue Aldactone 25 mg by mouth every morning Continue propranolol 10 mg by mouth twice a day Continue omeprazole 20 mg by mouth every morning, and we will refill Carafate 1 g 3 times a day between meals 1 more month We discussed indication for EGD and colonoscopy. Family would like to hold on this for now. They are waiting to see if she will qualify for any financial assistance. Follow-up with Dr. Leone Payor or myself in one month.   Amy  S Esterwood PA-C 10/27/2015   Cc: No ref. provider found

## 2015-10-27 NOTE — Patient Instructions (Signed)
Please go to the basement level to have your labs drawn.  [facility] [place]. 1. Carafate 2.  Chronulac  Continue the Omeprazole 40 mg. Daily.  Continue Lasix 40 mg.daily.  Continue Aldactone 25 mg daily. Continue the Kepra 500 mg. Daily. Continue Inderal 20 mg. Daily.

## 2015-10-28 LAB — AFP TUMOR MARKER: AFP-Tumor Marker: 8.2 ng/mL — ABNORMAL HIGH (ref <6.1)

## 2015-10-28 NOTE — Progress Notes (Signed)
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 Gessner, MD, [facility]

## 2015-10-29 ENCOUNTER — Telehealth: Payer: Self-pay | Admitting: Physician Assistant

## 2015-10-29 ENCOUNTER — Telehealth: Payer: Self-pay | Admitting: Internal Medicine

## 2015-10-29 LAB — URINE CULTURE

## 2015-10-29 NOTE — Telephone Encounter (Signed)
A user error has taken place: ERROR °

## 2015-10-30 NOTE — Telephone Encounter (Signed)
Calling about the urine culture. It is back today. Let me know and I will contact her daughter.

## 2015-11-09 ENCOUNTER — Telehealth: Payer: Self-pay | Admitting: Physician Assistant

## 2015-11-10 ENCOUNTER — Telehealth: Payer: Self-pay | Admitting: Physician Assistant

## 2015-11-10 ENCOUNTER — Other Ambulatory Visit: Payer: Self-pay | Admitting: *Deleted

## 2015-11-10 MED ORDER — DICYCLOMINE HCL 10 MG PO CAPS: 10.0000 mg | ORAL_CAPSULE | Freq: Three times a day (TID) | ORAL | 0 refills | Status: DC

## 2015-11-10 MED ORDER — FUROSEMIDE 40 MG PO TABS: 40.0000 mg | ORAL_TABLET | Freq: Every day | ORAL | 0 refills | Status: DC

## 2015-11-10 NOTE — Telephone Encounter (Signed)
I called the patient's daughter in law Jasmine to advise I sent 1 refill for the Lasix and Bentyl ( dicyclomine) 10 mg. No more refills will be prescribed until she is seen here  I adavised  the patient needs to keep the scheduled appointment with Dr. Leone PayorGessner for 10[facility]18 at [facility].  He will go over medications at that time.

## 2015-11-11 NOTE — Telephone Encounter (Signed)
New patient with cirrhosis and hepatic encephalopathy  appt with Dr Leone PayorGessner 11/26/15 or earlier appointment with Mike GipAmy Esterwood.  She complains of aching "all over", but also has mid abdominal pain she associates with taking the lactulose. Family is giving her Bentyl three times daily. Suggestions?

## 2015-11-11 NOTE — Telephone Encounter (Signed)
Try changing lactulose to 2 tablespoons three times a day  Try using 2 dicyclomine at a time

## 2015-11-12 ENCOUNTER — Other Ambulatory Visit: Payer: Self-pay

## 2015-11-12 MED ORDER — LACTULOSE 10 GM/15ML PO SOLN: 20.0000 g | Freq: Three times a day (TID) | ORAL | 6 refills | Status: DC

## 2015-11-12 NOTE — Telephone Encounter (Signed)
Changes given to Jasmin. She will instruct the patient.

## 2015-12-07 ENCOUNTER — Telehealth: Payer: Self-pay | Admitting: Internal Medicine

## 2015-12-07 MED ORDER — PROPRANOLOL HCL 10 MG PO TABS: 10.0000 mg | ORAL_TABLET | Freq: Two times a day (BID) | ORAL | 0 refills | Status: DC

## 2015-12-07 NOTE — Telephone Encounter (Signed)
According to Sept office note patient was to continue propranolol , refill sent in and patient to follow up with us later on this week.

## 2015-12-09 ENCOUNTER — Ambulatory Visit (INDEPENDENT_AMBULATORY_CARE_PROVIDER_SITE_OTHER): Payer: Self-pay | Admitting: Internal Medicine

## 2015-12-09 ENCOUNTER — Encounter: Payer: Self-pay | Admitting: Internal Medicine

## 2015-12-09 ENCOUNTER — Encounter (INDEPENDENT_AMBULATORY_CARE_PROVIDER_SITE_OTHER): Payer: Self-pay

## 2015-12-09 VITALS — BP 102/68 | HR 72 | Ht 61.0 in | Wt 174.0 lb

## 2015-12-09 DIAGNOSIS — K7290 Hepatic failure, unspecified without coma: Secondary | ICD-10-CM

## 2015-12-09 DIAGNOSIS — R188 Other ascites: Principal | ICD-10-CM

## 2015-12-09 DIAGNOSIS — R1013 Epigastric pain: Secondary | ICD-10-CM

## 2015-12-09 DIAGNOSIS — K7460 Unspecified cirrhosis of liver: Secondary | ICD-10-CM

## 2015-12-09 DIAGNOSIS — K7682 Hepatic encephalopathy: Secondary | ICD-10-CM

## 2015-12-09 MED ORDER — PROPRANOLOL HCL 10 MG PO TABS: 10.0000 mg | ORAL_TABLET | Freq: Two times a day (BID) | ORAL | 2 refills | Status: AC

## 2015-12-09 MED ORDER — FUROSEMIDE 40 MG PO TABS: 40.0000 mg | ORAL_TABLET | Freq: Every day | ORAL | 2 refills | Status: DC

## 2015-12-09 NOTE — Patient Instructions (Addendum)
We have sent the following medications to your pharmacy for you to pick up at your convenience: Furosemide and propranolol   Follow up with us as needed.    Take 10 grams of the lactalose three times a day.     I appreciate the opportunity to care for you. Stan Headarl Gessner, MD, [facility]

## 2015-12-09 NOTE — Progress Notes (Signed)
[facility] 65 [facility] 1950/04/11 [id number]  Assessment & Plan:   1. Cirrhosis of liver with ascites, unspecified hepatic cirrhosis type (HCC)   2. Encephalopathy, hepatic (HCC)   3. Dyspepsia    She seems improved and stable. She is going to return to [place] because her visa will expire. She will get care from her physician there. She may be having some GI distress from the lactulose, we'll see if she can get by with 10 g 3 times a day i.e. 1 tablespoon 3 times a day.  Other medication should remain the same.  She can return here when necessary if she is back in the [place].  Subjective:   Chief Complaint: Follow-up of cirrhosis, abdominal pain  HPI Very nice [age] year old [place] woman here with family, daughter-in-law is a Designer, multimediabilingual nurse who serves as Equities traderinterpreter. The patient has been doing reasonably well with less edema in her legs now that she is on regular diuretics. She has intermittent abdominal pain which dicyclomine is helped, there is also discomfort and dislike of lactulose, she is gassy with that she does not like the taste. She's having one bowel movement a day. Mental status has been clear there is no excessive sleepiness or anything like that per the family. The family reports that she was taking something in a packet once or twice a day for her encephalopathy prescribed by her [place] physician, she liked the taste of that better. I wonder if that was not Kristalose. She also thinks Carafate helps her stomach.  No Known Allergies Outpatient Medications Prior to Visit  Medication Sig Dispense Refill  . dicyclomine (BENTYL) 10 MG capsule Take 2 capsules (20 mg total) by mouth 3 (three) times daily before meals. 90 capsule 0  . levETIRAcetam (KEPPRA) 500 MG tablet Take 500 mg by mouth daily.    Marland Kitchen. omeprazole (PRILOSEC) 40 MG capsule Take 1 capsule (40 mg total) by mouth daily. 30 capsule 1  . spironolactone (ALDACTONE) 25 MG tablet Take 25 mg by mouth daily.    . sucralfate  (CARAFATE) 1 GM/10ML suspension Take 10 mLs (1 g total) by mouth 4 (four) times daily -  with meals and at bedtime. 420 mL 1  . furosemide (LASIX) 40 MG tablet Take 1 tablet (40 mg total) by mouth daily. 30 tablet 0  . lactulose (CHRONULAC) 10 GM/15ML solution Take 30 mLs (20 g total) by mouth 3 (three) times daily. (Patient taking differently: Take 10 g by mouth 3 (three) times daily. ) 1800 mL 6  . propranolol (INDERAL) 10 MG tablet Take 1 tablet (10 mg total) by mouth 2 (two) times daily. 60 tablet 0   No facility-administered medications prior to visit.    Past Medical History:  Diagnosis Date  . Cirrhosis (HCC)   . Encephalopathy, hepatic (HCC)   . Gastritis   . GI bleed 10/2015  . Hypertension    Past Surgical History:  Procedure Laterality Date  . CHOLECYSTECTOMY     Social History   Social History  . Marital status: Married    Spouse name: N/A  . Number of children: 12  . Years of education: N/A   Social History Main Topics  . Smoking status: Never Smoker  . Smokeless tobacco: Never Used  . Alcohol use No  . Drug use: Unknown  . Sexual activity: Not Asked   Other Topics Concern  . None   Social History Narrative  . None   Family History  Problem Relation Age of Onset  .  Diabetes Mother   . Diabetes Brother   . Hypertension Brother   . Heart disease Brother     Review of Systems Cold intolerance, slight cough productive of clear sputum for 1 week this has been going around the family  Objective:   Physical Exam BP 102/68   Pulse 72   Ht 5\' 1"  (1.549 m)   Wt 174 lb (78.9 kg)   BMI 32.88 kg/m  Pleasant Hispanic woman somewhat chronically ill-appearing Eyes anicteric Lungs are clear Heart S1-S2 no rubs murmurs or gallops Abdomen is soft nontender no obvious ascites Extremities show trace pretibial edema As best I can tell she is alert and 3, her speech is clear, she has no asterixis

## 2017-04-06 ENCOUNTER — Other Ambulatory Visit: Payer: Self-pay

## 2017-04-06 ENCOUNTER — Encounter (HOSPITAL_COMMUNITY): Payer: Self-pay

## 2017-04-06 ENCOUNTER — Emergency Department (HOSPITAL_COMMUNITY): Payer: Self-pay

## 2017-04-06 ENCOUNTER — Observation Stay (HOSPITAL_COMMUNITY)
Admission: EM | Admit: 2017-04-06 | Discharge: 2017-04-07 | Disposition: A | Discharge status: Home / Self Care | Payer: Self-pay | Attending: Internal Medicine | Admitting: Internal Medicine

## 2017-04-06 DIAGNOSIS — Z79899 Other long term (current) drug therapy: Secondary | ICD-10-CM | POA: Insufficient documentation

## 2017-04-06 DIAGNOSIS — J918 Pleural effusion in other conditions classified elsewhere: Secondary | ICD-10-CM

## 2017-04-06 DIAGNOSIS — G8929 Other chronic pain: Secondary | ICD-10-CM

## 2017-04-06 DIAGNOSIS — R011 Cardiac murmur, unspecified: Secondary | ICD-10-CM

## 2017-04-06 DIAGNOSIS — K2970 Gastritis, unspecified, without bleeding: Secondary | ICD-10-CM | POA: Insufficient documentation

## 2017-04-06 DIAGNOSIS — K2950 Unspecified chronic gastritis without bleeding: Secondary | ICD-10-CM

## 2017-04-06 DIAGNOSIS — I872 Venous insufficiency (chronic) (peripheral): Secondary | ICD-10-CM

## 2017-04-06 DIAGNOSIS — M549 Dorsalgia, unspecified: Secondary | ICD-10-CM

## 2017-04-06 DIAGNOSIS — J449 Chronic obstructive pulmonary disease, unspecified: Secondary | ICD-10-CM | POA: Insufficient documentation

## 2017-04-06 DIAGNOSIS — I119 Hypertensive heart disease without heart failure: Secondary | ICD-10-CM | POA: Insufficient documentation

## 2017-04-06 DIAGNOSIS — M545 Low back pain: Secondary | ICD-10-CM | POA: Insufficient documentation

## 2017-04-06 DIAGNOSIS — J111 Influenza due to unidentified influenza virus with other respiratory manifestations: Secondary | ICD-10-CM | POA: Diagnosis present

## 2017-04-06 DIAGNOSIS — J90 Pleural effusion, not elsewhere classified: Secondary | ICD-10-CM | POA: Insufficient documentation

## 2017-04-06 DIAGNOSIS — K7460 Unspecified cirrhosis of liver: Secondary | ICD-10-CM | POA: Insufficient documentation

## 2017-04-06 DIAGNOSIS — D696 Thrombocytopenia, unspecified: Secondary | ICD-10-CM

## 2017-04-06 DIAGNOSIS — J101 Influenza due to other identified influenza virus with other respiratory manifestations: Principal | ICD-10-CM | POA: Insufficient documentation

## 2017-04-06 DIAGNOSIS — K7290 Hepatic failure, unspecified without coma: Secondary | ICD-10-CM

## 2017-04-06 HISTORY — DX: Headache, unspecified: R51.9

## 2017-04-06 HISTORY — DX: Depression, unspecified: F32.A

## 2017-04-06 HISTORY — DX: Pneumonia, unspecified organism: J18.9

## 2017-04-06 HISTORY — DX: Unspecified chronic bronchitis: J42

## 2017-04-06 HISTORY — DX: Dorsalgia, unspecified: M54.9

## 2017-04-06 HISTORY — DX: Major depressive disorder, single episode, unspecified: F32.9

## 2017-04-06 HISTORY — DX: Headache: R51

## 2017-04-06 HISTORY — DX: Other chronic pain: G89.29

## 2017-04-06 LAB — AMMONIA: AMMONIA: 44 umol/L — AB (ref 9–35)

## 2017-04-06 LAB — BASIC METABOLIC PANEL
Anion gap: 11 (ref 5–15)
BUN: 17 mg/dL (ref 6–20)
CO2: 24 mmol/L (ref 22–32)
Calcium: 8 mg/dL — ABNORMAL LOW (ref 8.9–10.3)
Chloride: 101 mmol/L (ref 101–111)
Creatinine, Ser: 1.07 mg/dL — ABNORMAL HIGH (ref 0.44–1.00)
GFR calc Af Amer: >60 mL/min (ref >60)
GFR calc non Af Amer: 53 mL/min — ABNORMAL LOW (ref >60)
Glucose, Bld: 85 mg/dL (ref 65–99)
POTASSIUM: 3.8 mmol/L (ref 3.5–5.1)
Sodium: 136 mmol/L (ref 135–145)

## 2017-04-06 LAB — I-STAT TROPONIN, ED: Troponin i, poc: 0 ng/mL (ref 0.00–0.08)

## 2017-04-06 LAB — I-STAT CG4 LACTIC ACID, ED
LACTIC ACID, VENOUS: 2.03 mmol/L — AB (ref 0.5–1.9)
Lactic Acid, Venous: 1.92 mmol/L — ABNORMAL HIGH (ref 0.5–1.9)

## 2017-04-06 LAB — HEPATIC FUNCTION PANEL
ALBUMIN: 2.2 g/dL — AB (ref 3.5–5.0)
ALT: 35 U/L (ref 14–54)
AST: 93 U/L — AB (ref 15–41)
Alkaline Phosphatase: 106 U/L (ref 38–126)
BILIRUBIN TOTAL: 4.1 mg/dL — AB (ref 0.3–1.2)
Bilirubin, Direct: 1.7 mg/dL — ABNORMAL HIGH (ref 0.1–0.5)
Indirect Bilirubin: 2.4 mg/dL — ABNORMAL HIGH (ref 0.3–0.9)
Total Protein: 6.8 g/dL (ref 6.5–8.1)

## 2017-04-06 LAB — CBC
HEMATOCRIT: 32.6 % — AB (ref 36.0–46.0)
Hemoglobin: 10.7 g/dL — ABNORMAL LOW (ref 12.0–15.0)
MCH: 34.5 pg — ABNORMAL HIGH (ref 26.0–34.0)
MCHC: 32.8 g/dL (ref 30.0–36.0)
MCV: 105.2 fL — ABNORMAL HIGH (ref 78.0–100.0)
Platelets: 42 10*3/uL — ABNORMAL LOW (ref 150–400)
RBC: 3.1 MIL/uL — ABNORMAL LOW (ref 3.87–5.11)
RDW: 17.1 % — AB (ref 11.5–15.5)
WBC: 2.4 10*3/uL — ABNORMAL LOW (ref 4.0–10.5)

## 2017-04-06 LAB — INFLUENZA PANEL BY PCR (TYPE A & B)
INFLBPCR: NEGATIVE
Influenza A By PCR: POSITIVE — AB

## 2017-04-06 LAB — BRAIN NATRIURETIC PEPTIDE: B Natriuretic Peptide: 169.6 pg/mL — ABNORMAL HIGH (ref 0.0–100.0)

## 2017-04-06 LAB — PROTIME-INR
INR: 2.11
PROTHROMBIN TIME: 23.5 s — AB (ref 11.4–15.2)

## 2017-04-06 MED ORDER — FENTANYL CITRATE (PF) 100 MCG/2ML IJ SOLN
50.0000 ug | Freq: Once | INTRAMUSCULAR | Status: DC
  Filled 2017-04-06: qty 2

## 2017-04-06 MED ORDER — FUROSEMIDE 10 MG/ML IJ SOLN
40.0000 mg | Freq: Once | INTRAMUSCULAR | Status: AC
Start: 2017-04-06 — End: 2017-04-06
  Administered 2017-04-06: 40 mg via INTRAVENOUS
  Filled 2017-04-06: qty 4

## 2017-04-06 MED ORDER — ACETAMINOPHEN 325 MG PO TABS
650.0000 mg | ORAL_TABLET | Freq: Four times a day (QID) | ORAL | Status: DC | PRN
  Administered 2017-04-06: 650 mg via ORAL
  Filled 2017-04-06 (×2): qty 2

## 2017-04-06 MED ORDER — PROPRANOLOL HCL 20 MG PO TABS
10.0000 mg | ORAL_TABLET | Freq: Every day | ORAL | Status: DC
  Administered 2017-04-07: 10 mg via ORAL
  Filled 2017-04-06 (×3): qty 1

## 2017-04-06 MED ORDER — OSELTAMIVIR PHOSPHATE 75 MG PO CAPS
75.0000 mg | ORAL_CAPSULE | Freq: Two times a day (BID) | ORAL | Status: DC
  Administered 2017-04-06: 75 mg via ORAL
  Filled 2017-04-06 (×2): qty 1

## 2017-04-06 MED ORDER — PANTOPRAZOLE SODIUM 40 MG PO TBEC
40.0000 mg | DELAYED_RELEASE_TABLET | Freq: Every day | ORAL | Status: DC
  Administered 2017-04-06 – 2017-04-07 (×2): 40 mg via ORAL
  Filled 2017-04-06 (×2): qty 1

## 2017-04-06 MED ORDER — IPRATROPIUM-ALBUTEROL 0.5-2.5 (3) MG/3ML IN SOLN: 3.0000 mL | Freq: Four times a day (QID) | RESPIRATORY_TRACT | Status: DC | PRN

## 2017-04-06 MED ORDER — IPRATROPIUM-ALBUTEROL 0.5-2.5 (3) MG/3ML IN SOLN
3.0000 mL | Freq: Four times a day (QID) | RESPIRATORY_TRACT | Status: DC
  Administered 2017-04-06: 3 mL via RESPIRATORY_TRACT
  Filled 2017-04-06: qty 3

## 2017-04-06 MED ORDER — LACTULOSE 10 GM/15ML PO SOLN
10.0000 g | Freq: Every day | ORAL | Status: DC
  Administered 2017-04-06 – 2017-04-07 (×2): 10 g via ORAL
  Filled 2017-04-06 (×3): qty 15

## 2017-04-06 MED ORDER — ACETAMINOPHEN 650 MG RE SUPP: 650.0000 mg | Freq: Four times a day (QID) | RECTAL | Status: DC | PRN

## 2017-04-06 MED ORDER — FUROSEMIDE 40 MG PO TABS
40.0000 mg | ORAL_TABLET | Freq: Every day | ORAL | Status: DC
  Administered 2017-04-07: 40 mg via ORAL
  Filled 2017-04-06 (×2): qty 1

## 2017-04-06 MED ORDER — GUAIFENESIN-DM 100-10 MG/5ML PO SYRP
5.0000 mL | ORAL_SOLUTION | ORAL | Status: DC | PRN
  Administered 2017-04-06 – 2017-04-07 (×3): 5 mL via ORAL
  Filled 2017-04-06 (×3): qty 5

## 2017-04-06 MED ORDER — IPRATROPIUM-ALBUTEROL 0.5-2.5 (3) MG/3ML IN SOLN
3.0000 mL | Freq: Once | RESPIRATORY_TRACT | Status: AC
  Administered 2017-04-06: 3 mL via RESPIRATORY_TRACT
  Filled 2017-04-06: qty 3

## 2017-04-06 MED ORDER — SUCRALFATE 1 GM/10ML PO SUSP
1.0000 g | Freq: Three times a day (TID) | ORAL | Status: DC
  Administered 2017-04-06 – 2017-04-07 (×4): 1 g via ORAL
  Filled 2017-04-06 (×6): qty 10

## 2017-04-06 MED ORDER — OSELTAMIVIR PHOSPHATE 75 MG PO CAPS
75.0000 mg | ORAL_CAPSULE | Freq: Once | ORAL | Status: AC
  Administered 2017-04-06: 75 mg via ORAL
  Filled 2017-04-06: qty 1

## 2017-04-06 MED ORDER — SPIRONOLACTONE 50 MG PO TABS
100.0000 mg | ORAL_TABLET | Freq: Every day | ORAL | Status: DC
  Administered 2017-04-07: 100 mg via ORAL
  Filled 2017-04-06 (×2): qty 2
  Filled 2017-04-06: qty 1

## 2017-04-06 NOTE — ED Notes (Signed)
Patients family requesting to use patients own medication for pain.

## 2017-04-06 NOTE — ED Provider Notes (Signed)
[facility] EMERGENCY [facility] Provider Note   CSN: [id number] Arrival date & time: 04/06/17  [id number]     History   Chief Complaint No chief complaint on file.   HPI Margaretha GlassingMerced Genao is a 67 y.o. female.  The history is provided by the patient and a relative. A language interpreter was used.   Ignacia MarvelMerced [name] is a 67 y.o. female who presents to the Emergency Department complaining of sob/back pain.  Level V caveat due to language barrier.  She has a history of cirrhosis And presents to the emergency department for 3 days of low back pain and shortness of breath.  Symptoms are described as severe and constant in nature.  She also endorses bilateral leg pain and progressive leg edema.  She states that she has been having falls secondary to her back pain.  No prior similar symptoms.  No reports of fever, vomiting, chest pain. Past Medical History:  Diagnosis Date  . Cirrhosis (HCC)   . Encephalopathy, hepatic (HCC)   . Gastritis   . GI bleed 10/2015  . Hypertension     Patient Active Problem List   Diagnosis Date Noted  . Flu 04/06/2017  . Hepatic encephalopathy (HCC) 10/09/2015  . Anemia 10/09/2015  . Acute kidney injury (HCC) 10/09/2015  . Thrombocytopenia (HCC) 10/09/2015  . Pleural effusion 10/09/2015  . Abdominal pain 10/09/2015  . Gastritis 10/09/2015  . Bradycardia 10/09/2015  . Cirrhosis (HCC)   . Abdominal pain, epigastric   . Increased ammonia level     Past Surgical History:  Procedure Laterality Date  . CHOLECYSTECTOMY      OB History    No data available       Home Medications    Prior to Admission medications   Medication Sig Start Date End Date Taking? Authorizing Provider  furosemide (LASIX) 40 MG tablet Take 1 tablet (40 mg total) by mouth daily. 12/09/15  Yes Iva BoopGessner, Carl E, MD  lactulose (CHRONULAC) 10 GM/15ML solution Take 10 g by mouth daily.    Yes [provider]  levETIRAcetam (KEPPRA) 500 MG tablet Take 500 mg  by mouth daily.   Yes [provider]  propranolol (INDERAL) 10 MG tablet Take 1 tablet (10 mg total) by mouth 2 (two) times daily. Patient taking differently: Take 10 mg by mouth daily.  12/09/15  Yes Iva BoopGessner, Carl E, MD  spironolactone (ALDACTONE) 25 MG tablet Take 25 mg by mouth daily.   Yes [provider]  omeprazole (PRILOSEC) 40 MG capsule Take 1 capsule (40 mg total) by mouth daily. Patient not taking: Reported on 04/06/2017 10/22/15   Ward, Layla MawKristen N, DO  sucralfate (CARAFATE) 1 GM/10ML suspension Take 10 mLs (1 g total) by mouth 4 (four) times daily -  with meals and at bedtime. Patient not taking: Reported on 04/06/2017 10/27/15   Sammuel CooperEsterwood, Amy S, PA-C    Family History Family History  Problem Relation Age of Onset  . Diabetes Mother   . Diabetes Brother   . Hypertension Brother   . Heart disease Brother     Social History Social History   Tobacco Use  . Smoking status: Never Smoker  . Smokeless tobacco: Never Used  Substance Use Topics  . Alcohol use: No  . Drug use: Not on file     Allergies   Patient has no known allergies.   Review of Systems Review of Systems  All other systems reviewed and are negative.    Physical Exam Updated Vital  Signs BP (!) 100/56   Pulse 80   Temp 100.3 F (37.9 C) (Rectal)   Resp 20   SpO2 96%   Physical Exam  Constitutional: She appears well-developed and well-nourished. She appears distressed.  HENT:  Head: Normocephalic and atraumatic.  Cardiovascular: Normal rate and regular rhythm.  No murmur heard. Pulmonary/Chest:  Diffuse rhonchi.Tachypnea with accessory muscle use.  Speaks in short phrases.  Abdominal: Soft. There is no tenderness. There is no rebound and no guarding.  Musculoskeletal: She exhibits no tenderness.  2+ pitting edema to BLE  Neurological: She is alert.  Mildly confused, slow to answer questions.  4/5 strength in all four extremities  Skin: Skin is warm and dry.  Psychiatric:  She has a normal mood and affect. Her behavior is normal.  Nursing note and vitals reviewed.    ED Treatments / Results  Labs (all labs ordered are listed, but only abnormal results are displayed) Labs Reviewed  BASIC METABOLIC PANEL - Abnormal; Notable for the following components:      Result Value   Creatinine, Ser 1.07 (*)    Calcium 8.0 (*)    GFR calc non Af Amer 53 (*)    All other components within normal limits  CBC - Abnormal; Notable for the following components:   WBC 2.4 (*)    RBC 3.10 (*)    Hemoglobin 10.7 (*)    HCT 32.6 (*)    MCV 105.2 (*)    MCH 34.5 (*)    RDW 17.1 (*)    Platelets 42 (*)    All other components within normal limits  PROTIME-INR - Abnormal; Notable for the following components:   Prothrombin Time 23.5 (*)    All other components within normal limits  HEPATIC FUNCTION PANEL - Abnormal; Notable for the following components:   Albumin 2.2 (*)    AST 93 (*)    Total Bilirubin 4.1 (*)    Bilirubin, Direct 1.7 (*)    Indirect Bilirubin 2.4 (*)    All other components within normal limits  BRAIN NATRIURETIC PEPTIDE - Abnormal; Notable for the following components:   B Natriuretic Peptide 169.6 (*)    All other components within normal limits  AMMONIA - Abnormal; Notable for the following components:   Ammonia 44 (*)    All other components within normal limits  INFLUENZA PANEL BY PCR (TYPE A & B) - Abnormal; Notable for the following components:   Influenza A By PCR POSITIVE (*)    All other components within normal limits  I-STAT CG4 LACTIC ACID, ED - Abnormal; Notable for the following components:   Lactic Acid, Venous 2.03 (*)    All other components within normal limits  I-STAT CG4 LACTIC ACID, ED - Abnormal; Notable for the following components:   Lactic Acid, Venous 1.92 (*)    All other components within normal limits  URINALYSIS, ROUTINE W REFLEX MICROSCOPIC  HIV ANTIBODY (ROUTINE TESTING)  I-STAT TROPONIN, ED    EKG  EKG  Interpretation  Date/Time:  Thursday April 06 2017 08:33:18 EST Ventricular Rate:  78 PR Interval:  180 QRS Duration: 80 QT Interval:  394 QTC Calculation: 449 R Axis:   60 Text Interpretation:  Normal sinus rhythm Cannot rule out Anterior infarct , age undetermined Abnormal ECG Confirmed by Tilden Fossa [phone]) on 04/06/2017 8:50:59 AM       Radiology Dg Chest Port 1 View  Result Date: 04/06/2017 CLINICAL DATA:  67 year old female with cough, congestion and chest  pain EXAM: PORTABLE CHEST 1 VIEW COMPARISON:  Prior chest x-ray 10/22/2015 FINDINGS: Marked enlargement of the cardiopericardial silhouette, similar compared to prior imaging. Blunting of the left costophrenic angle. Suspect small left pleural effusion and associated left basilar atelectasis. No evidence of overt pulmonary edema. No pneumothorax. No acute osseous abnormality. IMPRESSION: 1. Trace left pleural effusion and left lower lobe atelectasis versus infiltrate. 2. Stable cardiomegaly.  No pulmonary edema. Electronically Signed   By: Malachy Moan M.D.   On: 04/06/2017 09:47    Procedures Procedures (including critical care time)  Medications Ordered in ED Medications  fentaNYL (SUBLIMAZE) injection 50 mcg (50 mcg Intravenous Refused 04/06/17 1326)  acetaminophen (TYLENOL) tablet 650 mg (not administered)    Or  acetaminophen (TYLENOL) suppository 650 mg (not administered)  pantoprazole (PROTONIX) EC tablet 40 mg (not administered)  sucralfate (CARAFATE) 1 GM/10ML suspension 1 g (not administered)  furosemide (LASIX) tablet 40 mg (not administered)  spironolactone (ALDACTONE) tablet 100 mg (not administered)  lactulose (CHRONULAC) 10 GM/15ML solution 10 g (not administered)  propranolol (INDERAL) tablet 10 mg (not administered)  ipratropium-albuterol (DUONEB) 0.5-2.5 (3) MG/3ML nebulizer solution 3 mL (3 mLs Nebulization Given 04/06/17 1001)  furosemide (LASIX) injection 40 mg (40 mg Intravenous Given  04/06/17 1320)  oseltamivir (TAMIFLU) capsule 75 mg (75 mg Oral Given 04/06/17 1324)     Initial Impression / Assessment and Plan / ED Course  I have reviewed the triage vital signs and the nursing notes.  Pertinent labs & imaging results that were available during my care of the patient were reviewed by me and considered in my medical decision making (see chart for details).     Patient with history of cirrhosis here for evaluation of shortness of breath as well as low back pain.  She does have increased work of breathing with diffuse rhonchi and wheezes on examination.  She is partially improved following breathing treatment but does have some persistent symptoms.  No evidence of pneumonia on imaging but influenza is positive.  Plan to admit for further treatment.    Final Clinical Impressions(s) / ED Diagnoses   Final diagnoses:  Influenza A    ED Discharge Orders    None       Tilden Fossa, MD 04/06/17 1545

## 2017-04-06 NOTE — H&P (Signed)
Date: 04/06/2017               Patient Name:  Kelly Mckinney MRN: [id number]  DOB: 1951-02-11 Age / Sex: 67 y.o., female   PCP: Patient, No Pcp Per         Medical Service: Internal Medicine Teaching Service         Attending Physician: Dr. Doneen PoissonKlima, Lawrence, MD    First Contact: Dr. Thornell MuleWinfrey, Brandon Pager: [phone]  Second Contact: Dr. Arnetha CourserAmin, Sumayya Pager: [phone]       After Hours (After 5p/  First Contact Pager: [phone]  weekends / holidays): Second Contact Pager: [phone]   Chief Complaint: cough, back pain  History of Present Illness: 67 yo female with PMH of cirrhosis (diagnosed 4 years ago), chronic  bronchitis, hepatic encephalopathy, gastritis.  He presented to the emergency department with subjective fevers and cough productive of clear sputum for 1 week.  She attributes this to the change in weather was very warm in [place] she is visiting her family from.  She arrived here 3 weeks ago.  Before this she states that she was hospitalized for an infection treated with antibiotics and given 3 L of fluid.  She cannot remember where they said the infection was or what particular therapy she was given.  She also reports that she has felt more short of breath as pleuritic pain with deeper breathing and increase in abdominal pain in the left and right lower quadrants.  2 months ago she says she fell and hurt her back she had an x-ray done not show any broken bones and more recently her back pain is been worse.  She noticed that she has been gaining fluid in her legs and abdomen since her stay in the hospital in [place].  She has not had any increase in diarrhea she had a normal bowel movement yesterday, she has had no nausea or vomiting.  ED course: Patient arrived with hypotension, increased respiratory rate, she had an ammonia of 44, a BNP of 169.6, her albumin was 2.2 and indirect bili 2.4 with a direct bili 1.7 and elevated AST to 93.  She was found to be influenza a positive.  She  had a hemoglobin of 10.7 and MCV of 105.2. Her INR was 2.11.  She had a x-ray that showed left pleural effusion, cardiomegaly, interstitial edema mild.     Meds:  Current Meds  Medication Sig  . furosemide (LASIX) 40 MG tablet Take 1 tablet (40 mg total) by mouth daily.  Marland Kitchen. lactulose (CHRONULAC) 10 GM/15ML solution Take 10 g by mouth daily.   Marland Kitchen. levETIRAcetam (KEPPRA) 500 MG tablet Take 500 mg by mouth daily.  . propranolol (INDERAL) 10 MG tablet Take 1 tablet (10 mg total) by mouth 2 (two) times daily. (Patient taking differently: Take 10 mg by mouth daily. )  . spironolactone (ALDACTONE) 25 MG tablet Take 25 mg by mouth daily.     Allergies: Allergies as of 04/06/2017  . (No Known Allergies)   Past Medical History:  Diagnosis Date  . Cirrhosis (HCC)   . Encephalopathy, hepatic (HCC)   . Gastritis   . GI bleed 10/2015  . Hypertension     Family History:  Family History  Problem Relation Age of Onset  . Diabetes Mother   . Diabetes Brother   . Hypertension Brother   . Heart disease Brother     Social History:  Social History   Socioeconomic History  . Marital  status: Married    Spouse name: Not on file  . Number of children: 12  . Years of education: Not on file  . Highest education level: Not on file  Social Needs  . Financial resource strain: Not on file  . Food insecurity - worry: Not on file  . Food insecurity - inability: Not on file  . Transportation needs - medical: Not on file  . Transportation needs - non-medical: Not on file  Occupational History  . Not on file  Tobacco Use  . Smoking status: Never Smoker  . Smokeless tobacco: Never Used  Substance and Sexual Activity  . Alcohol use: No  . Drug use: Not on file  . Sexual activity: Not on file  Other Topics Concern  . Not on file  Social History Narrative  . Not on file    Review of Systems: A complete ROS was negative except as per HPI.   Physical Exam: Blood pressure (!) 96/42, pulse 75,  temperature 99.5 F (37.5 C), temperature source Oral, resp. rate 20, SpO2 95 %. Physical Exam  Constitutional: She is oriented to person, place, and time. No distress.  HENT:  Head: Normocephalic and atraumatic.  Cardiovascular: Normal rate and regular rhythm. Exam reveals no gallop and no friction rub.  Murmur heard. Pulmonary/Chest: Effort normal and breath sounds normal. No respiratory distress. She has no wheezes. She has no rales. She exhibits no tenderness.  Abdominal: Soft. She exhibits distension and fluid wave. She exhibits no mass. There is no tenderness. There is no rebound and no guarding.  Musculoskeletal: She exhibits edema (bilateral 3+ LE edema).  Neurological: She is alert and oriented to person, place, and time.  Skin: She is not diaphoretic. There is erythema (bilateral LE).    EKG: personally reviewed my interpretation is sinus rhythm   CXR: personally reviewed my interpretation is left pleural effusion, cardiomegaly, interstitial edema mild.   Assessment & Plan by Problem: Active Problems:   Flu  Influenza Patient presents with signs and symptoms concerning for influenza infection was found to be influenza A positive.    -tamiflu x 5 days -droplet precautions -robitussin DM for cough  Cirrhosis Diagnosed 4 years ago, majority of treatment takes place in [place].  Presents fluid overloaded has not had good access to diuretic therapy since leaving [place].    -will restart lasix 40mg  and spironolactone 100mg   -continue lactulose 10g daily titrate to 3 bowel movements daily -continue propanolol 10mg  -will hold keppra it's possible they were using this as therapy for hepatic encephalopathy  Gastritis Has history of gastritis and GI bleed, complaining of LLQ and RLQ pain mainly during this visit  -will continue omeprazole 40mg   and sucralfate -acetaminophen for discomfort  Back pain Chronic issue, likely worsened by influenza infection  -will use tylenol  for now seems overall mild can escalate as needed    Dispo: Admit patient to Observation with expected length of stay less than 2 midnights.  Signed: Angelita Ingles, MD 04/06/2017, 6:54 PM  Thornell Mule MD PGY-1 Internal Medicine Pager # [phone]

## 2017-04-06 NOTE — ED Notes (Signed)
Hospitalist at bedside 

## 2017-04-06 NOTE — ED Triage Notes (Signed)
Patient complains of increased SOB and CP x 2-3 days with cough. Wheezing on arrival, states that it hurts to move and pain with inspiration. Also has cirrhosis and taking meds for same

## 2017-04-06 NOTE — Progress Notes (Signed)
Admission note:  Arrival Method:  Patient arrived on stretcher from ED accompanied by the family (son). Mental Orientation:  Alert and oriented x 4, patient is spanish speaking but son is helping to translate. Telemetry: N/A. Assessment: See doc flow sheets. Skin: Warm, dry and intact with some skin tags on the back.  Bilateral lower extremity edema 2+, red warm to touch and also discoloration noted on bilateral feet.  IV: Right AC.  SL. Pain: denies any pain currently. Tubes: N/A Safety Measures: Bed alarm initiated, in low position, call bell and phone within reach. Fall Prevention Safety Plan: Reviewed the plan, verbalized understanding. Admission Screening: In process. 6700 Orientation: Patient has been oriented to the unit, staff and to the room.

## 2017-04-07 LAB — GLUCOSE, CAPILLARY: Glucose-Capillary: 106 mg/dL — ABNORMAL HIGH (ref 65–99)

## 2017-04-07 LAB — COMPREHENSIVE METABOLIC PANEL
ALT: 32 U/L (ref 14–54)
ANION GAP: 11 (ref 5–15)
AST: 81 U/L — ABNORMAL HIGH (ref 15–41)
Albumin: 1.9 g/dL — ABNORMAL LOW (ref 3.5–5.0)
Alkaline Phosphatase: 96 U/L (ref 38–126)
BILIRUBIN TOTAL: 3.8 mg/dL — AB (ref 0.3–1.2)
BUN: 22 mg/dL — ABNORMAL HIGH (ref 6–20)
CHLORIDE: 101 mmol/L (ref 101–111)
CO2: 24 mmol/L (ref 22–32)
Calcium: 8 mg/dL — ABNORMAL LOW (ref 8.9–10.3)
Creatinine, Ser: 1.18 mg/dL — ABNORMAL HIGH (ref 0.44–1.00)
GFR calc Af Amer: 55 mL/min — ABNORMAL LOW (ref >60)
GFR, EST NON AFRICAN AMERICAN: 47 mL/min — AB (ref >60)
Glucose, Bld: 76 mg/dL (ref 65–99)
Potassium: 3.5 mmol/L (ref 3.5–5.1)
Sodium: 136 mmol/L (ref 135–145)
TOTAL PROTEIN: 5.5 g/dL — AB (ref 6.5–8.1)

## 2017-04-07 LAB — HIV ANTIBODY (ROUTINE TESTING W REFLEX): HIV SCREEN 4TH GENERATION: NONREACTIVE

## 2017-04-07 LAB — CBC
HEMATOCRIT: 30.3 % — AB (ref 36.0–46.0)
Hemoglobin: 9.8 g/dL — ABNORMAL LOW (ref 12.0–15.0)
MCH: 33.9 pg (ref 26.0–34.0)
MCHC: 32.3 g/dL (ref 30.0–36.0)
MCV: 104.8 fL — AB (ref 78.0–100.0)
PLATELETS: 34 10*3/uL — AB (ref 150–400)
RBC: 2.89 MIL/uL — ABNORMAL LOW (ref 3.87–5.11)
RDW: 16.8 % — AB (ref 11.5–15.5)
WBC: 2.4 10*3/uL — ABNORMAL LOW (ref 4.0–10.5)

## 2017-04-07 MED ORDER — OSELTAMIVIR PHOSPHATE 30 MG PO CAPS
30.0000 mg | ORAL_CAPSULE | Freq: Two times a day (BID) | ORAL | Status: DC
  Administered 2017-04-07: 30 mg via ORAL
  Filled 2017-04-07 (×2): qty 1

## 2017-04-07 MED ORDER — FLUTICASONE-SALMETEROL 100-50 MCG/DOSE IN AEPB: 1.0000 | INHALATION_SPRAY | Freq: Two times a day (BID) | RESPIRATORY_TRACT | 2 refills | Status: AC | PRN

## 2017-04-07 MED ORDER — FUROSEMIDE 40 MG PO TABS: 40.0000 mg | ORAL_TABLET | Freq: Every day | ORAL | 0 refills | Status: AC

## 2017-04-07 MED ORDER — LACTULOSE 10 GM/15ML PO SOLN: 10.0000 g | Freq: Three times a day (TID) | ORAL | Status: DC | PRN

## 2017-04-07 MED ORDER — ACETAMINOPHEN 500 MG PO TABS: 500.0000 mg | ORAL_TABLET | Freq: Four times a day (QID) | ORAL | 0 refills | Status: AC | PRN

## 2017-04-07 MED ORDER — SPIRONOLACTONE 100 MG PO TABS: 100.0000 mg | ORAL_TABLET | Freq: Every day | ORAL | 0 refills | Status: AC

## 2017-04-07 MED ORDER — IPRATROPIUM-ALBUTEROL 0.5-2.5 (3) MG/3ML IN SOLN
3.0000 mL | RESPIRATORY_TRACT | Status: DC
  Administered 2017-04-07: 3 mL via RESPIRATORY_TRACT

## 2017-04-07 MED ORDER — GUAIFENESIN-DM 100-10 MG/5ML PO SYRP: 5.0000 mL | ORAL_SOLUTION | ORAL | 0 refills | Status: AC | PRN

## 2017-04-07 MED ORDER — LACTULOSE 10 GM/15ML PO SOLN: ORAL | 2 refills | Status: AC

## 2017-04-07 MED ORDER — OSELTAMIVIR PHOSPHATE 30 MG PO CAPS: 30.0000 mg | ORAL_CAPSULE | Freq: Two times a day (BID) | ORAL | 0 refills | Status: AC

## 2017-04-07 NOTE — Discharge Summary (Signed)
Name: Kelly GlassingMerced Mckinney MRN: [id number] DOB: May 06, 1950 67 y.o. PCP: Patient, No Pcp Per  Date of Admission: 04/06/2017  8:37 AM Date of Discharge:  Attending Physician: Doneen PoissonKlima, Lawrence, MD  Discharge Diagnosis: 1.  Active Problems:   Flu   Discharge Medications: Allergies as of 04/07/2017   No Known Allergies     Medication List    STOP taking these medications   levETIRAcetam 500 MG tablet Commonly known as:  KEPPRA     TAKE these medications   acetaminophen 500 MG tablet Commonly known as:  TYLENOL Take 1 tablet (500 mg total) by mouth every 6 (six) hours as needed for up to 15 days for mild pain or moderate pain (or Fever >/= 101).   Fluticasone-Salmeterol 100-50 MCG/DOSE Aepb Commonly known as:  ADVAIR Inhale 1 puff into the lungs 2 (two) times daily as needed. IrFloSol brand from [place]   furosemide 40 MG tablet Commonly known as:  LASIX Take 1 tablet (40 mg total) by mouth daily. Start taking on:  04/08/2017   guaiFENesin-dextromethorphan 100-10 MG/5ML syrup Commonly known as:  ROBITUSSIN DM Take 5 mLs by mouth every 4 (four) hours as needed for cough.   lactulose 10 GM/15ML solution Commonly known as:  CHRONULAC Take 15-30 milliliters daily as needed  to produce 2-3 soft bowel movements daily What changed:    how much to take  how to take this  when to take this  additional instructions   omeprazole 40 MG capsule Commonly known as:  PRILOSEC Take 1 capsule (40 mg total) by mouth daily.   oseltamivir 30 MG capsule Commonly known as:  TAMIFLU Take 1 capsule (30 mg total) by mouth 2 (two) times daily.   propranolol 10 MG tablet Commonly known as:  INDERAL Take 1 tablet (10 mg total) by mouth 2 (two) times daily. What changed:  when to take this   spironolactone 100 MG tablet Commonly known as:  ALDACTONE Take 1 tablet (100 mg total) by mouth daily. Start taking on:  04/08/2017 What changed:    medication strength  how much to take     sucralfate 1 GM/10ML suspension Commonly known as:  CARAFATE Take 10 mLs (1 g total) by mouth 4 (four) times daily -  with meals and at bedtime.       Disposition and follow-up:   Ms.Kelly Mckinney [facility] St[facility].  At the hospital follow up visit please address:  Influenza infection  -ensure resolution and no superimposed bacterial infection  Cirrhosis  -increased pts spironolactone, ensure this is helping with edema and check potassium and blood pressure for stability.    COPD  -ensure advair controlling pts symptoms well  2.  Labs / imaging needed at time of follow-up: cmp, cbc  3.  Pending labs/ test needing follow-up: none  Follow-up Appointments: Follow-up Information    Iva BoopGessner, Carl E, MD Follow up in 2 week(s).   Specialty:  Gastroenterology Why:  Pt has appointment for February 26th at 10:00am  Tiene una cita en el consultorio de su mdico gastrointestinal el 26 de febrero a las 10:00 am Contact information: [address] [place] [address] [phone]           Hospital Course by problem list: Active Problems:   Flu Influenza infection Cirrhosis COPD  Patient arrived with diffuse myalgias, cough, shortness of breath, and pleuritic chest pain.  She was found to be influenza A positive.  She was treated  with Tamiflu and Tylenol not exceeding 3 g in 1 day.  The following morning her symptoms had abated.  Additionally when she arrived she had some mild wheezing so she was given nebulized ipratropium/albuterol therapy every 6 hours which helped resolve this issue.  She had been running low on her Advair and was given a new prescription when she was discharged.  She has been dealing with cirrhosis for about 4 years prior to coming for influenza treatment.  She reports more recent fluid retention partly due to receiving 3 L of IV fluids in a hospital in [place].  She is unsure why she got so much IV  fluids being a cirrhotic patient, and the best she can remember is they told her she had an infection but she is not sure where or if she got any other treatment besides the fluids.  We were able to increase her spironolactone dose to 100 mg daily from 25 mg and we kept her Lasix at 40 mg daily.  Her blood pressure tolerated these dosages well and I expect that her edema will begin to improve.  We set up close follow-up with her GI physician for 26 February to follow-up on her cirrhosis and the edema resulting from this.  She was discharged feeling better and ultimately on a better regimen for her edema.     Discharge Vitals:   BP (!) 102/58 (BP Location: Left Arm)   Pulse 76   Temp 98 F (36.7 C) (Oral)   Resp 18   Ht 5\' 1"  (1.549 m)   Wt 173 lb 4.5 oz (78.6 kg)   SpO2 97%   BMI 32.74 kg/m   Pertinent Labs, Studies, and Procedures:  CBC Latest Ref Rng & Units 04/07/2017 04/06/2017 10/25/2015  WBC 4.0 - 10.5 K/uL 2.4(L) 2.4(L) 3.6(L)  Hemoglobin 12.0 - 15.0 g/dL 4.5(W) 10.7(L) 10.3(L)  Hematocrit 36.0 - 46.0 % 30.3(L) 32.6(L) 31.1(L)  Platelets 150 - 400 K/uL 34(L) 42(L) 58(L)   CMP Latest Ref Rng & Units 04/07/2017 04/06/2017 10/25/2015  Glucose 65 - 99 mg/dL 76 85 098(J)  BUN 6 - 20 mg/dL 19(J) 17 12  Creatinine 0.44 - 1.00 mg/dL 4.78(G) 9.56(O) 1.30  Sodium 135 - 145 mmol/L 136 136 139  Potassium 3.5 - 5.1 mmol/L 3.5 3.8 4.2  Chloride 101 - 111 mmol/L 101 101 110  CO2 22 - 32 mmol/L 24 24 25   Calcium 8.9 - 10.3 mg/dL 8.0(L) 8.0(L) 8.6(L)  Total Protein 6.5 - 8.1 g/dL 8.6(V) 6.8 6.5  Total Bilirubin 0.3 - 1.2 mg/dL 7.8(I) 4.1(H) 3.3(H)  Alkaline Phos 38 - 126 U/L 96 106 112  AST 15 - 41 U/L 81(H) 93(H) 51(H)  ALT 14 - 54 U/L 32 35 29   CLINICAL DATA:  67 year old female with cough, congestion and chest pain   EXAM: PORTABLE CHEST 1 VIEW   COMPARISON:  Prior chest x-ray 10/22/2015   FINDINGS: Marked enlargement of the cardiopericardial silhouette, similar compared to prior  imaging. Blunting of the left costophrenic angle. Suspect small left pleural effusion and associated left basilar atelectasis. No evidence of overt pulmonary edema. No pneumothorax. No acute osseous abnormality.   IMPRESSION: 1. Trace left pleural effusion and left lower lobe atelectasis versus infiltrate. 2. Stable cardiomegaly.  No pulmonary edema.     Electronically Signed   By: Malachy Moan M.D.   On: 04/06/2017 09:47   Discharge Instructions: Discharge Instructions    Diet - low sodium heart healthy   Complete by:  As directed    Discharge instructions   Complete by:  As directed    you have an appointment at your gastrointestinal doctor's office on february 26th at 10:00am.  We have increased your dosage of spironolactone to 100mg , continue your other medications as instructed on your discharge summary.  We have discontinued your keppra for the time being, you can discuss this with Dr. Leone Payor at your appointment.  You will need to have your potassium checked in his office as we have increased your spironolactone.  Otherwise I expect your symptoms will get better as you get over having the flu.  Additionally, your extra fluid in your legs should continue to improve if you are able to take your medications.   Tiene una cita en el consultorio de su mdico gastrointestinal el 26 de febrero a las 10:00 am.  Hemos aumentado su dosis de espironolactona a 100 mg, contine con sus otros medicamentos como se indica en su resumen del alta. Hemos suspendido su Keppra por el momento, puede discutir esto con el Dr. Leone Payor en su cita. Necesitar que le revisen el potasio en su consultorio, ya que hemos aumentado su espironolactona. De lo contrario, espero que sus sntomas mejoren a medida que supera la gripe. Adems, su lquido adicional en las piernas debera continuar mejorando si puede tomar sus medicamentos.   Increase activity slowly   Complete by:  As directed        Signed: Angelita Ingles, MD 04/07/2017, 2:36 PM

## 2017-04-07 NOTE — Care Management Note (Signed)
Case Management Note  Patient Details  Name: Margaretha GlassingMerced Wiedemann MRN: [id number] Date of Birth: 04/18/1950  Subjective/Objective:   History of Cirrhosis, Gastritis, Gi Bleed admitted as observation for influenza.               Action/Plan: Majority of treatment takes place in [place], PCP.  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   Expected Discharge Date:  04/07/17               Expected Discharge Plan:  Home/Self Care  In-House Referral:  NA  Discharge planning Services  CM Consult  Status of Service:  Completed, signed off  Additional Comments:  Yancey FlemingsKimberly R Becton, RN  Nurse case manager 04/07/2017, 2:53 PM

## 2017-04-07 NOTE — Care Management Note (Addendum)
Case Management Note  Patient Details  Name: Kelly GlassingMerced Mckinney MRN: [id number] Date of Birth: [birth year]  Subjective/Objective:    Admitted for Influenza.  Prior to admission lived at home family (son/daughter).  PCP [place] in [place].          Action/Plan:  In to speak with patient, son at bedside. Call [facility] to [facility] [employer] at [phone], used interpreter ID [phone]. Permission given to speak in front of son.  Discussed obtaining PCP here, patient/son verbalized understanding.  Will call [facility] to establish PCP.  States they have no medicaid or insurance, will need assistance paying for medications.  Explained the [place] [employer] and how it works.  MATCH FORM given to patient.  Expected Discharge Date:  04/07/17               Expected Discharge Plan:  Home/Self Care  In-House Referral:  NA  Discharge planning Services  CM Consult  Status of Service:  Completed, signed off  Additional Comments:  Yancey FlemingsKimberly R Becton, RN  Nurse case manager [place] 04/07/2017, 3:13 PM

## 2017-04-07 NOTE — Progress Notes (Signed)
Patient Discharge: Disposition: Patient discharged to home with family. Education: Reviewed medications, prescriptions, follow-up appointments and discharge instructions with the daughter who can speak english and verbalized understanding.  Refused interpreter. IV: Discontinued IV before discharge. Telemetry: N/A Transportation: Patient escorted out of the unit in w/c accompanied by the family. Belongings: Patient took all her belongings with her.

## 2017-04-07 NOTE — Progress Notes (Signed)
PHARMACY NOTE:  ANTIMICROBIAL RENAL DOSAGE ADJUSTMENT  Current antimicrobial regimen includes a mismatch between antimicrobial dosage and estimated renal function.  As per policy approved by the Pharmacy & Therapeutics and Medical Executive Committees, the antimicrobial dosage will be adjusted accordingly.  Current antimicrobial dosage:  Tamilflu 75 mg PO BID  Indication:  Influenza A positive  Renal Function:  Estimated Creatinine Clearance: 45.1 mL/min (A) (by C-G formula based on SCr of 1.18 mg/dL (H)). []      On intermittent HD, scheduled: []      On CRRT    Antimicrobial dosage has been changed to:  Tamiflu 30 mg BID  Additional comments:   2 doses of Tamiflu 75 mg have been given.  Tamiflu 30 mg BID x 8 doses, to complete 5 days treatment.   Thank you for allowing pharmacy to be a part of this patient's care.  Dennie Fettersgan, Arnette Driggs Donovan, [facility] CenterRPH  Pager: [phone] (or [phone]) 04/07/2017 8:54 AM

## 2017-04-07 NOTE — Progress Notes (Addendum)
   Subjective: Kelly Mckinney is feeling much better today, she reports decreased cough, decreased shortness of breath decreased back pain.    Objective:  Vital signs in last 24 hours: Vitals:   04/06/17 2202 04/07/17 0510 04/07/17 0824 04/07/17 1007  BP: (!) 102/52 (!) 106/51  (!) 98/55  Pulse: 68 68  68  Resp: 19 17    Temp: 98.6 F (37 C) 98.2 F (36.8 C)    TempSrc: Oral Oral    SpO2: 99% 97%    Weight: 173 lb 4.5 oz (78.6 kg)     Height:   5\' 1"  (1.549 m)    Physical Exam  Constitutional: She is oriented to person, place, and time. No distress.  Cardiovascular: Normal rate, regular rhythm and normal heart sounds. Exam reveals no gallop and no friction rub.  No murmur heard. Pulmonary/Chest: Effort normal. No respiratory distress. She has wheezes in the left lower field. She has rales. She exhibits no tenderness.  Abdominal: Soft. Bowel sounds are normal. She exhibits distension. She exhibits no mass. There is no tenderness. There is no rebound and no guarding.  Musculoskeletal: She exhibits edema (LE edema bilaterally 3+).  Neurological: She is alert and oriented to person, place, and time.  Skin: She is not diaphoretic. No erythema (interval improvement in LE erythema bilaterally).    Assessment/Plan:  Active Problems:   Flu  Influenza Patient presents with signs and symptoms concerning for influenza infection was found to be influenza A positive.     -tamiflu x 5 days -droplet precautions -robitussin DM for cough -tylenol for aches and pains   Cirrhosis Diagnosed 4 years ago, majority of treatment takes place in [place].  Presents fluid overloaded has not had good access to diuretic therapy since leaving [place].     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   Angelita InglesWinfrey, William B, MD 04/07/2017, 10:37 AM Thornell MuleBrandon Winfrey MD PGY-1 Internal Medicine Pager # [phone]  Internal Medicine Attending  Date: 04/07/2017  Patient name: Kelly Mckinney Medical record number: [id number] Date of birth: Oct 22, 1950 Age: 67 y.o. Gender: female  I saw and evaluated the patient. I reviewed the resident's note by Dr. Frances FurbishWinfrey and I agree with the resident's findings and plans as documented in his progress note.  Please see my H&P dated 04/07/2017 and attached to Dr. Starr SinclairWinfrey's H&P dated 04/06/2017 for the specifics of my evaluation, assessment, and plan from earlier in the day.

## 2017-04-18 ENCOUNTER — Ambulatory Visit: Payer: Self-pay | Admitting: Physician Assistant

## 2018-01-18 IMAGING — CT CT HEAD W/O CM
4 series · 16 of 47 positions shown, 18 images · non-contrast
Comparison: None.

CLINICAL DATA: Confusion.  History of hypertension.

EXAM:
CT HEAD WITHOUT CONTRAST
TECHNIQUE: Contiguous axial images were obtained from the base of the skull
through the vertex without intravenous contrast.

[Series 2: head without · axial · non-contrast · 0.43mm/px · z∈[+689,+804]mm · 7 of 31 slices shown, 9 images]
[im 4/31  brain]
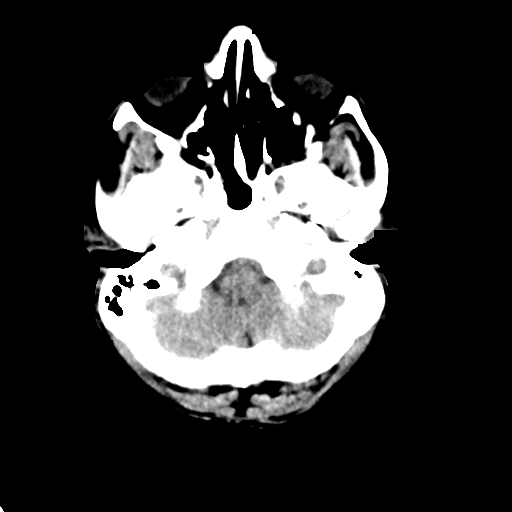
[im 4/31  bone]
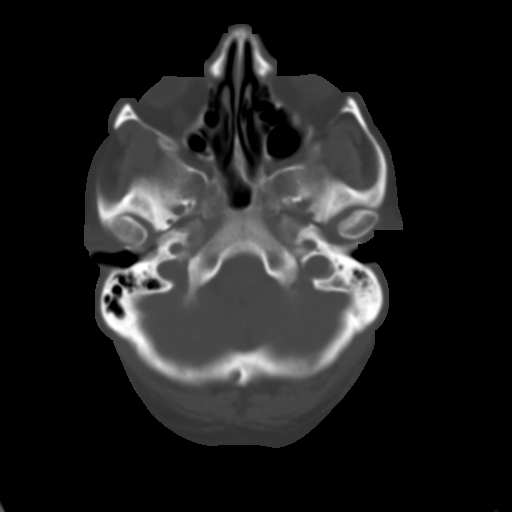
[im 8/31  brain]
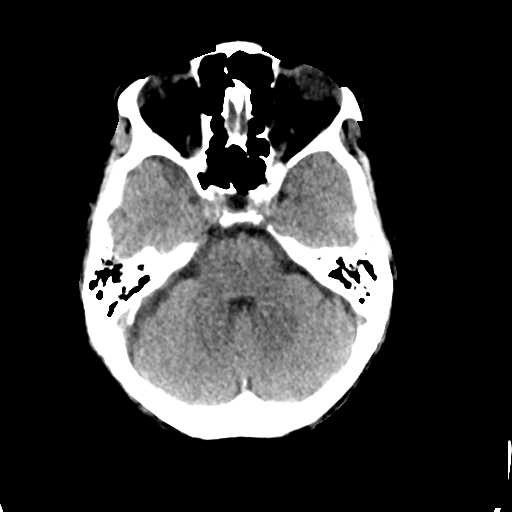
[im 12/31  brain]
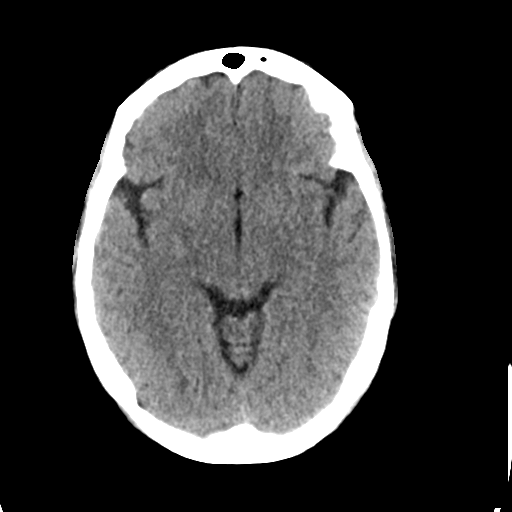
[im 16/31  brain]
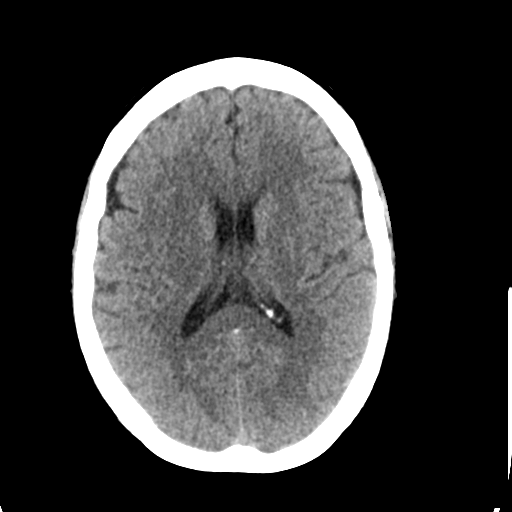
[im 19/31  brain]
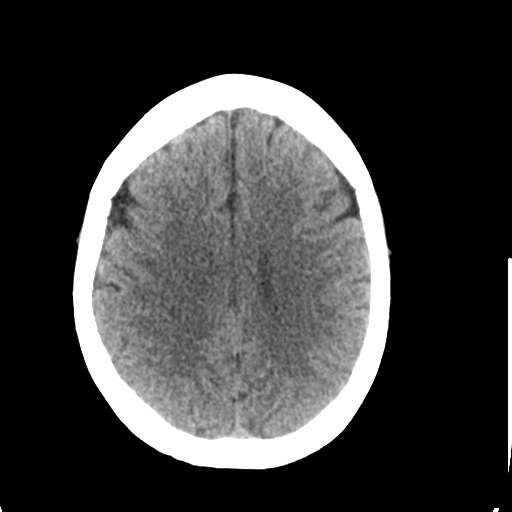
[im 19/31  bone]
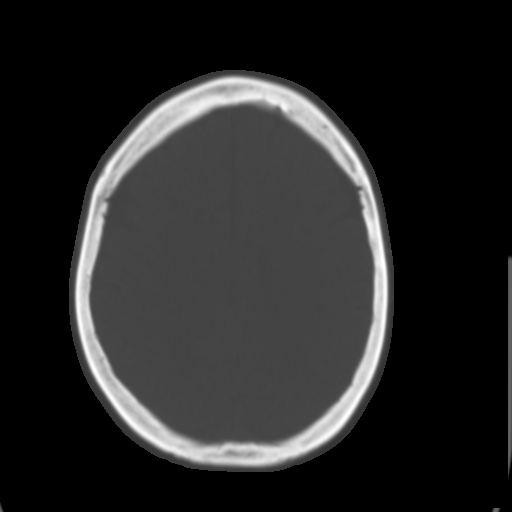
[im 23/31  brain]
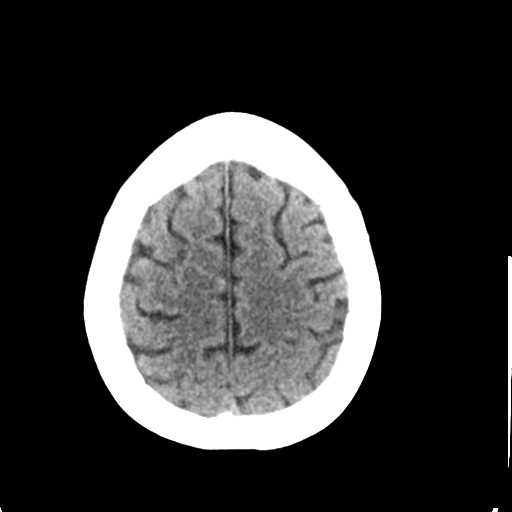
[im 27/31  brain]
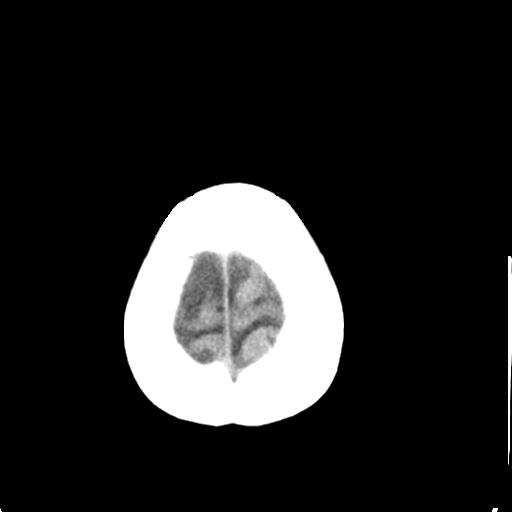

[Series 3: head bone · axial · 0.43mm/px · z∈[+688,+718]mm · 3 of 76 slices shown]
[im 8/76  bone]
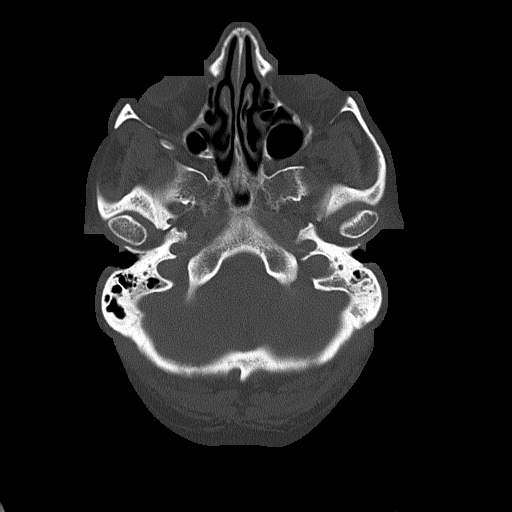
[im 16/76  bone]
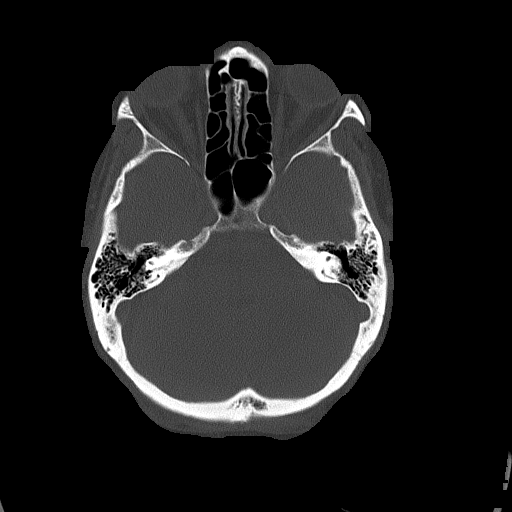
[im 23/76  bone]
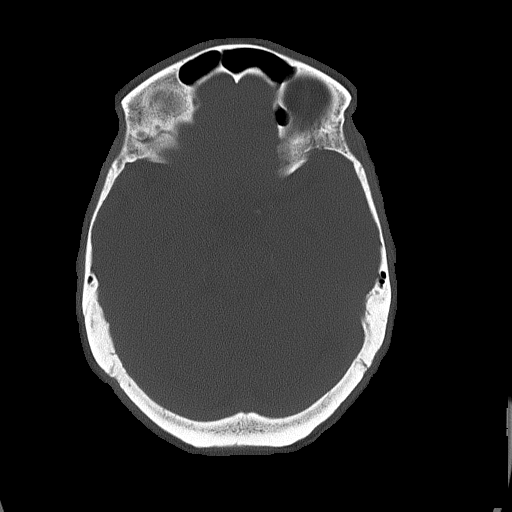

[Series 4: head without cor · coronal · non-contrast · 0.32mm/px · 3 of 60 slices shown]
[im 20/60  brain]
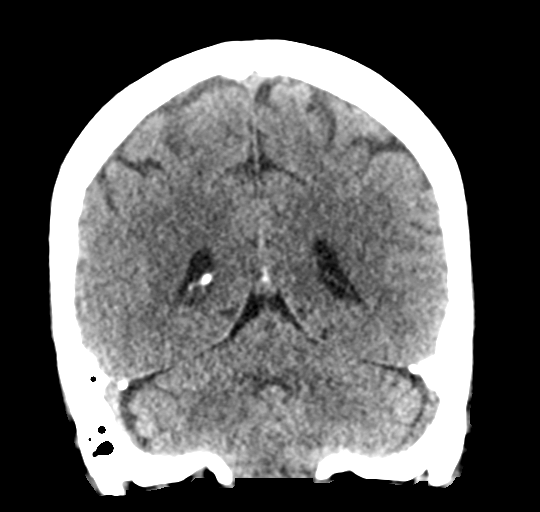
[im 27/60  brain]
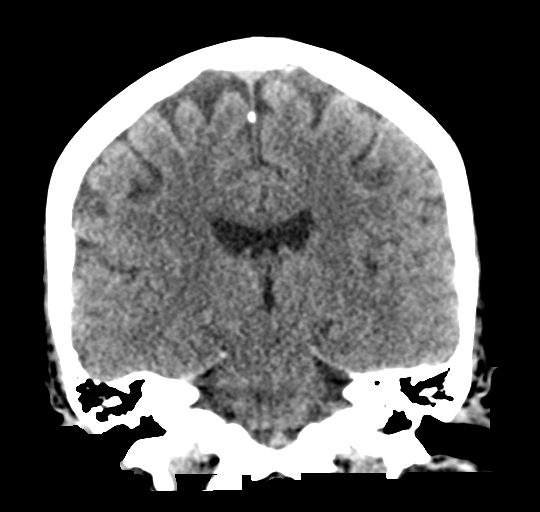
[im 33/60  brain]
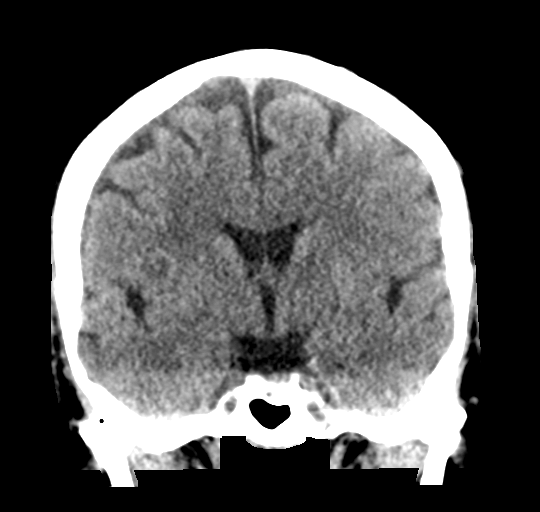

[Series 5: head without sag · sagittal · non-contrast · 0.32mm/px · 3 of 48 slices shown]
[im 16/48  brain]
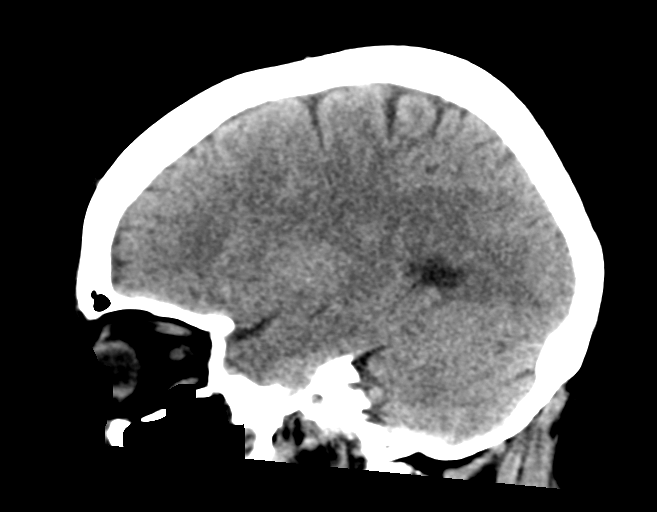
[im 24/48  brain]
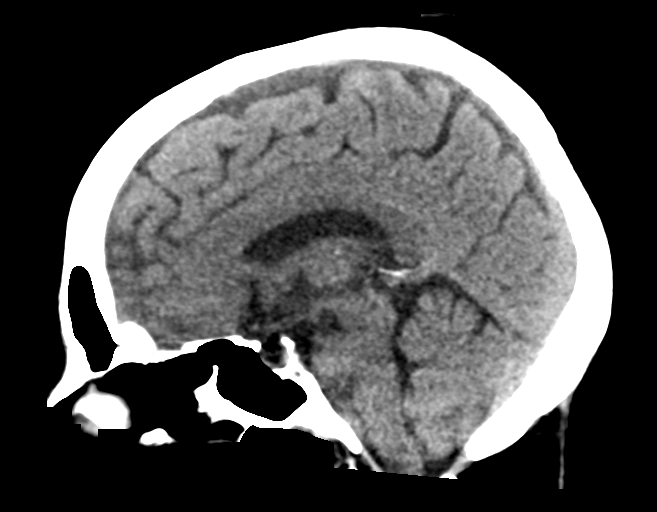
[im 32/48  brain]
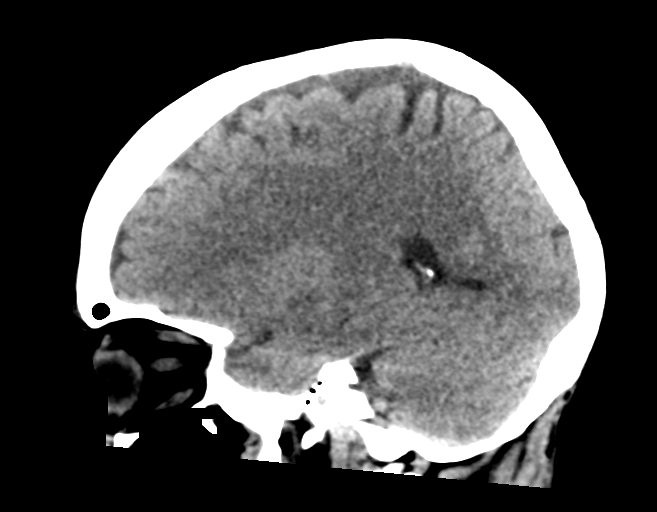

[16 of 47 positions shown; findings below may reference images not displayed]

FINDINGS: Brain: Mild atrophy with sulcal prominence. Gray-white
differentiation is maintained. No CT evidence of acute large
territory infarct. No intraparenchymal or extra-axial mass or
hemorrhage. Normal size and configuration of the ventricles and the
basilar cisterns. No midline shift.

Vascular: No hyperdense vessel or unexpected calcification.

Skull: No displaced calvarial fracture.

Sinuses/Orbits: Limited visualization the paranasal sinuses and
mastoid air cells is normal. No air-fluid levels.

Other: Regional soft tissues appear normal.
IMPRESSION: Mild atrophy without acute intracranial process.
